# Patient Record
Sex: Female | Born: 1970 | Race: White | Hispanic: No | Marital: Single | Smoking: Never smoker
Health system: Southern US, Community
[De-identification: ages and names within clinical notes are randomized; demographics above are authoritative.]

## PROBLEM LIST (undated history)

## (undated) DIAGNOSIS — H3530 Unspecified macular degeneration: Secondary | ICD-10-CM

## (undated) DIAGNOSIS — F32A Depression, unspecified: Secondary | ICD-10-CM

## (undated) DIAGNOSIS — E282 Polycystic ovarian syndrome: Secondary | ICD-10-CM

## (undated) DIAGNOSIS — I10 Essential (primary) hypertension: Secondary | ICD-10-CM

## (undated) DIAGNOSIS — E785 Hyperlipidemia, unspecified: Secondary | ICD-10-CM

## (undated) DIAGNOSIS — E119 Type 2 diabetes mellitus without complications: Secondary | ICD-10-CM

## (undated) DIAGNOSIS — F329 Major depressive disorder, single episode, unspecified: Secondary | ICD-10-CM

## (undated) DIAGNOSIS — R809 Proteinuria, unspecified: Secondary | ICD-10-CM

## (undated) HISTORY — DX: Proteinuria, unspecified: R80.9

## (undated) HISTORY — DX: Hyperlipidemia, unspecified: E78.5

## (undated) HISTORY — PX: PARTIAL HYSTERECTOMY: SHX80

## (undated) HISTORY — DX: Major depressive disorder, single episode, unspecified: F32.9

## (undated) HISTORY — DX: Depression, unspecified: F32.A

## (undated) HISTORY — DX: Essential (primary) hypertension: I10

## (undated) HISTORY — DX: Unspecified macular degeneration: H35.30

## (undated) HISTORY — DX: Type 2 diabetes mellitus without complications: E11.9

## (undated) HISTORY — DX: Polycystic ovarian syndrome: E28.2

---

## 1999-10-23 ENCOUNTER — Other Ambulatory Visit: Admission: RE | Admit: 1999-10-23 | Discharge: 1999-10-23 | Payer: Self-pay | Admitting: Obstetrics and Gynecology

## 2000-10-28 ENCOUNTER — Other Ambulatory Visit: Admission: RE | Admit: 2000-10-28 | Discharge: 2000-10-28 | Payer: Self-pay | Admitting: Obstetrics and Gynecology

## 2001-11-01 ENCOUNTER — Other Ambulatory Visit: Admission: RE | Admit: 2001-11-01 | Discharge: 2001-11-01 | Payer: Self-pay | Admitting: Obstetrics and Gynecology

## 2002-11-01 ENCOUNTER — Other Ambulatory Visit: Admission: RE | Admit: 2002-11-01 | Discharge: 2002-11-01 | Payer: Self-pay | Admitting: Obstetrics and Gynecology

## 2003-11-08 ENCOUNTER — Other Ambulatory Visit: Admission: RE | Admit: 2003-11-08 | Discharge: 2003-11-08 | Payer: Self-pay | Admitting: Obstetrics and Gynecology

## 2004-03-25 ENCOUNTER — Encounter: Admission: RE | Admit: 2004-03-25 | Discharge: 2004-06-23 | Payer: Self-pay | Admitting: Family Medicine

## 2004-08-22 ENCOUNTER — Encounter: Admission: RE | Admit: 2004-08-22 | Discharge: 2004-11-20 | Payer: Self-pay | Admitting: Family Medicine

## 2004-11-11 ENCOUNTER — Other Ambulatory Visit: Admission: RE | Admit: 2004-11-11 | Discharge: 2004-11-11 | Payer: Self-pay | Admitting: Obstetrics and Gynecology

## 2005-11-27 ENCOUNTER — Other Ambulatory Visit: Admission: RE | Admit: 2005-11-27 | Discharge: 2005-11-27 | Payer: Self-pay | Admitting: Obstetrics and Gynecology

## 2007-03-19 ENCOUNTER — Encounter: Admission: RE | Admit: 2007-03-19 | Discharge: 2007-03-19 | Payer: Self-pay | Admitting: Obstetrics and Gynecology

## 2007-03-26 ENCOUNTER — Encounter: Admission: RE | Admit: 2007-03-26 | Discharge: 2007-03-26 | Payer: Self-pay | Admitting: Obstetrics and Gynecology

## 2007-04-06 ENCOUNTER — Encounter: Admission: RE | Admit: 2007-04-06 | Discharge: 2007-04-06 | Payer: Self-pay | Admitting: Obstetrics and Gynecology

## 2007-04-06 ENCOUNTER — Encounter (INDEPENDENT_AMBULATORY_CARE_PROVIDER_SITE_OTHER): Payer: Self-pay | Admitting: Specialist

## 2007-05-25 ENCOUNTER — Inpatient Hospital Stay (HOSPITAL_COMMUNITY): Admission: RE | Admit: 2007-05-25 | Discharge: 2007-05-27 | Payer: Self-pay | Admitting: Obstetrics and Gynecology

## 2007-05-25 ENCOUNTER — Encounter (INDEPENDENT_AMBULATORY_CARE_PROVIDER_SITE_OTHER): Payer: Self-pay | Admitting: Obstetrics and Gynecology

## 2007-10-11 ENCOUNTER — Encounter: Admission: RE | Admit: 2007-10-11 | Discharge: 2007-10-11 | Payer: Self-pay | Admitting: Obstetrics and Gynecology

## 2008-03-23 ENCOUNTER — Encounter: Admission: RE | Admit: 2008-03-23 | Discharge: 2008-03-23 | Payer: Self-pay | Admitting: Obstetrics and Gynecology

## 2010-01-16 ENCOUNTER — Encounter: Admission: RE | Admit: 2010-01-16 | Discharge: 2010-01-16 | Payer: Self-pay | Admitting: Obstetrics and Gynecology

## 2011-01-03 ENCOUNTER — Other Ambulatory Visit: Payer: Self-pay | Admitting: Obstetrics and Gynecology

## 2011-01-03 DIAGNOSIS — Z1239 Encounter for other screening for malignant neoplasm of breast: Secondary | ICD-10-CM

## 2011-02-05 ENCOUNTER — Ambulatory Visit
Admission: RE | Admit: 2011-02-05 | Discharge: 2011-02-05 | Disposition: A | Discharge status: Home / Self Care | Payer: BC Managed Care – PPO | Source: Ambulatory Visit | Attending: Obstetrics and Gynecology | Admitting: Obstetrics and Gynecology

## 2011-02-05 DIAGNOSIS — Z1239 Encounter for other screening for malignant neoplasm of breast: Secondary | ICD-10-CM

## 2011-02-07 ENCOUNTER — Other Ambulatory Visit: Payer: Self-pay | Admitting: Obstetrics and Gynecology

## 2011-02-07 DIAGNOSIS — R928 Other abnormal and inconclusive findings on diagnostic imaging of breast: Secondary | ICD-10-CM

## 2011-02-17 ENCOUNTER — Ambulatory Visit
Admission: RE | Admit: 2011-02-17 | Discharge: 2011-02-17 | Disposition: A | Discharge status: Home / Self Care | Payer: BC Managed Care – PPO | Source: Ambulatory Visit | Attending: Obstetrics and Gynecology | Admitting: Obstetrics and Gynecology

## 2011-02-17 ENCOUNTER — Ambulatory Visit: Payer: BC Managed Care – PPO

## 2011-02-17 DIAGNOSIS — R928 Other abnormal and inconclusive findings on diagnostic imaging of breast: Secondary | ICD-10-CM

## 2011-02-18 ENCOUNTER — Other Ambulatory Visit: Payer: BC Managed Care – PPO

## 2011-04-18 NOTE — H&P (Signed)
Cristina Moore, DELAGARZA              ACCOUNT NO.:  [id number]   MEDICAL RECORD NO.:  [id number]          PATIENT TYPE:  AMB   LOCATION:  SDC                           [facility]ILITY:  [facility]   PHYSICIAN:  Guy Sandifer. Henderson Cloud, M.D. DATE OF BIRTH:  04-23-1971   DATE OF ADMISSION:  05/25/2007  DATE OF DISCHARGE:                              HISTORY & PHYSICAL   CHIEF COMPLAINT:  Enlarging uterine leiomyomata and adnexal masses.   HISTORY OF PRESENT ILLNESS:  This patient is a 40 year old single white  female, G0, P0, with known uterine leiomyomata.  She has been on the  birth control pill to control abnormal bleeding.  On examination her  uterus is enlarging.  On ultrasound on January 25, 2007, the uterus  measured 11.7 x 8.7 x 12.5 cm.  There are multiple leiomyomata ranging  from 7.9 cm to 1.2 cm.  The left adnexa contains a 4.4 cm mass which  could be either a pedunculated fibroid or an endometrioma, and the right  adnexa contains a 4.9 cm cystic structure, possibly an endometrioma,  possibly a cystadenoma.  After discussion of options, she is being  admitted for a total abdominal hysterectomy with possible bilateral  salpingo-oophorectomy.  Dr. Wanda Plump will place ureteral stents  preoperatively.  The potential risks and complications have been  reviewed preoperatively.   PAST MEDICAL HISTORY:  1. Depression.  2. Chronic hypertension.  3. Diabetes.  4. Hyperlipidemia.   PAST SURGICAL HISTORY:  Benign stereotactic biopsy of the left breast in  May 2008.   FAMILY HISTORY:  Mother is a twin.  Asthma in maternal grandfather.   SOCIAL HISTORY:  Denies tobacco, alcohol or drug abuse.   MEDICATIONS:  Paxil, Alesse, Actos, Amaryl, Lipitor and Zestril.   No known drug allergies.   REVIEW OF SYSTEMS:  NEUROLOGIC:  Denies headache.  CARDIAC:  Denies  chest pain.  PULMONARY:  Denies shortness of breath.  GASTROINTESTINAL:  Denies recent changes in bowel habits.   PHYSICAL EXAMINATION:  VITAL  SIGNS:  Height 5 feet 5 inches, weight 244  pounds.  Blood pressure 118/50.  HEENT:  Without thyromegaly.  LUNGS:  Clear to auscultation.  HEART:  Regular rate and rhythm.  BACK:  Without CVA tenderness.  BREASTS:  Without mass, retraction or discharge.  ABDOMEN:  Obese, soft, nontender, without palpable masses.  PELVIC:  Uterus is 12-14 weeks in size.  Adnexal exam is compromised.  EXTREMITIES:  Grossly within normal limits.  NEUROLOGIC:  Grossly within normal limits.   ASSESSMENT:  1. Uterine leiomyomata.  2. Bilateral adnexal masses.   PLAN:  Total abdominal hysterectomy, possible bilateral salpingo-  oophorectomy.      Guy Sandifer Henderson Cloud, M.D.  Electronically Signed     JET/MEDQ  D:  05/13/2007  T:  05/13/2007  Job:  [id number]

## 2011-04-18 NOTE — Op Note (Signed)
NAMEMACKINSEY, Cristina Moore              ACCOUNT NO.:  [id number]   MEDICAL RECORD NO.:  [id number]          PATIENT TYPE:  AMB   LOCATION:  SDC                           [facility]   PHYSICIAN:  Boston Service, M.D.DATE OF BIRTH:  1971-01-30   DATE OF PROCEDURE:  05/25/2007  DATE OF DISCHARGE:                               OPERATIVE REPORT   INDICATIONS:  A 40 year old female preop for hysterectomy, possible  bilateral salpingo-oophorectomy. Dr. Henderson Cloud had requested placement of  right and left ureteral stents prior to the procedure. Happy to help in  any way that I can.   POSTOPERATIVE DIAGNOSIS:  A 40 year old female preop for hysterectomy,  possible bilateral salpingo-oophorectomy. Dr. Henderson Cloud had requested  placement of right and left ureteral stents prior to the procedure.  Happy to help in any way that I can.   PROCEDURE:  Cystoscopy retrograde, right and left ureteral stent  placement.   SURGEON:  Humphries.   ASSISTANT:  None.   ANESTHESIA:  General.   SPECIMENS:  None.   ESTIMATED BLOOD LOSS:  Minimal.   COMPLICATIONS:  None obvious.   DRAINS:  Right and left ureteral stents.   DESCRIPTION OF PROCEDURE:  The patient was prepped and draped in the  dorsal lithotomy position. After institution of an adequate level of  general anesthesia, a well-lubricated, 21-French panendoscope was gently  inserted at the urethral meatus. Normal urethra and sphincter. Bladder  was carefully inspected with the 17- and 70-degree lenses.   Retrograde catheter was selected, positioned at the right and left  ureteral orifice. Retrograde films were obtained which showed normal  course and caliber of the ureter, pelvis and calices, with prompt  drainage at 3 to 5 minutes. A floppy-tip guide wire was then inserted,  advanced to a position just below the UPJ on both the right and left  sides. A 6-French end-hole catheter was advanced over the indwelling  guidewire. Guidewire was then  withdrawn about a centimeter inside the  ureteral catheter. Cystoscope was then gently withdrawn. Ureteral  catheters remained in place. An 18-French Foley was inserted with  immediate return of several hundred mL of clear irrigate. Balloon was  inflated. Foley was left to straight drain. Right and left ureteral  catheters were then tied to the indwelling Foley catheter and will  remain in place during the procedure and are to be removed when  considered appropriate by Dr. Henderson Cloud. He will dictate his portion of  the procedure.           ______________________________  Boston Service, M.D.     RH/MEDQ  D:  05/25/2007  T:  05/25/2007  Job:  [id number]   cc:   Guy Sandifer. Henderson Cloud, M.D.  Fax: [phone]

## 2011-04-18 NOTE — Discharge Summary (Signed)
Cristina Moore, Cristina Moore              ACCOUNT NO.:  [id number]   MEDICAL RECORD NO.:  [id number]          PATIENT TYPE:  INP   LOCATION:  9305                          [facility]ACILITY:  [facility]   PHYSICIAN:  Guy Sandifer. Henderson Cloud, M.D. DATE OF BIRTH:  24-Nov-1971   DATE OF ADMISSION:  05/25/2007  DATE OF DISCHARGE:  05/27/2007                               DISCHARGE SUMMARY   ADMITTING DIAGNOSES:  1. Uterine leiomyomata.  2. Adnexal masses.   DISCHARGE DIAGNOSIS:  Uterine leiomyomata.   PROCEDURE:  On May 25, 2007, total abdominal hysterectomy.   REASON FOR ADMISSION:  This patient is a 40 year old, single, white  female, G0, P0, with symptomatic enlarging uterine leiomyomata.  Details  are dictated in the history and physical.  She is admitted for surgical  management.   HOSPITAL COURSE:  The patient is admitted to the hospital and undergoes  the above procedure.  On the evening of surgery, she has good pain  relief, is ambulating with stable vital signs, and is afebrile.  Urine  output is adequate.  It is blood tinged secondary to ureteral stents  that were placed during surgery and removed immediately postoperatively.  On the first postoperative day, she is ambulating, tolerating a regular  diet, and is afebrile.  Blood sugars are managed with sliding scale  insulin.  On the day of discharge, she is passing flatus, tolerating  regular, remains afebrile.  Hemoglobin is 9.1.  Pathology is pending.   CONDITION ON DISCHARGE:  Good.   DIET:  Her diabetes diet is prescribed by her primary care physician.   MEDICATIONS:  1. Percocet 5/325 mg #40 one to two p.o. q.6h. p.r.n.  2. Ibuprofen 600 mg q.6h. p.r.n.  3. Tandem iron supplement 1 p.o. daily.   She will resume her other medications that she was taking  preoperatively.  The patient is to call the office for problems  including but not limited to temperature of 101 degrees, persistent  nausea, vomiting, heavy vaginal bleeding or increasing  pain.  She is to  refrain from operation of automobiles, heavy lifting or driving.  Follow-  up is in the office in 1 week for staple removal.     Guy Sandifer. Henderson Cloud, M.D.  Electronically Signed    JET/MEDQ  D:  05/27/2007  T:  05/27/2007  Job:  [id number]

## 2011-04-18 NOTE — Op Note (Signed)
Cristina Moore, Cristina Moore              ACCOUNT NO.:  [id number]   MEDICAL RECORD NO.:  [id number]          PATIENT TYPE:  AMB   LOCATION:  SDC                           [facility]   PHYSICIAN:  Guy Sandifer. Henderson Cloud, M.D. DATE OF BIRTH:  1971-07-21   DATE OF PROCEDURE:  05/25/2007  DATE OF DISCHARGE:                               OPERATIVE REPORT   PREOPERATIVE DIAGNOSIS:  1. Uterine leiomyomata.  2. Bilateral adnexal masses.   POSTOPERATIVE DIAGNOSIS:  Uterine leiomyomata.   PROCEDURE:  Total abdominal hysterectomy.   SURGEON:  Guy Sandifer. Henderson Cloud, M.D.   ASSISTANT:  Zelphia Cairo, M.D.   ANESTHESIA:  General endotracheal intubation.   SPECIMENS:  Uterus sent to pathology (weight 622 grams).   ESTIMATED BLOOD LOSS:  700 mL.   INDICATIONS:  The patient is a 40 year old single white female, G0, P0  with enlarging uterine leiomyomata.  Details are dictated history and  physical.  After discussion the options she is admitted for total  abdominal hysterectomy.  Possible bilateral salpingo-oophorectomy,  although the patient would like to keep one or both ovaries if they  appear normal.  Potential risks, complications have been discussed  preoperatively including but not limited to infection, bowel, bladder,  ureteral damage, bleeding requiring transfusion of blood products with  possible transfusion reaction, HIV and hepatitis acquisition, DVT, PE,  pneumonia, fistula formation, postoperative pain.  All questions were  answered and consent is signed on the chart.  Dr. Boston Service will  place bilateral ureteral stents prior to the hysterectomy.   FINDINGS:  Uterus contains multiple leiomyomata ranging from 3-8 cm.  There is at least one 3 cm pedunculated fibroid on the left uterine  fundus immediately above the ovary.  The ovaries appear normal.  Anterior posterior cul-de-sac is normal and the appendix is normal.   PROCEDURE:  The patient is taken to operating room where she  is  identified, placed in dorsosupine position and general anesthesia is  induced via endotracheal intubation.  She then has bilateral ureteral  stents placed by Dr. Wanda Plump which is dictated under separate note.  The patient is then repositioned in the frog-leg position where she is  prepped abdominally and vaginally.  Foley catheter was already in place.  She is draped in a sterile fashion.  Skin is entered through a  Pfannenstiel incision and dissection is carried out layers to the  peritoneum.  Peritoneum is incised and extended superiorly and  inferiorly.  The Balfour retractor is placed.  Bladder blade was placed  and the bowel was packed away.  Uterus is grasped with a thyroid  tenaculum and elevated in the operative field.  Then using the LigaSure  cautery instrument the left proximal ligaments are taken down.  The  anterior leaf of the broad ligament is taken down.  The remainder of the  proximal ligaments are taken down.  Bladder flap is developed.  Similar  procedure is carried out on the right side.  The superior branch of the  uterine vessels are taken down bilaterally.  Then using a malleable  retractor as a backstop, a long  knife is used to amputate the uterine  fundus from the cervix.  While doing this, several large collateral  vessels were encountered which are clamped.  The specimen is completely  cut free.  The cardinal ligaments are taken down bilaterally using  clamps with 0 Monocryl pops.  The vagina is then entered bilaterally and  cut and the specimen is cut free with Satinsky scissors.  Inspection  reveals entire cervix has been removed.  Vagina is then closed with  figure-of-eight 0 Monocryl sutures.  Good hemostasis is noted all  around.  The angle sutures were then tied in the midline.  Copious  irrigation is carried out and good hemostasis is noted.  The ureters  were palpated throughout the case and felt to be well clear of the area  of surgery.  Packs and  retractors were removed and the anterior  peritoneum was closed in running fashion with 2-0 Monocryl suture which  was also used to reapproximate the pyramidalis muscle in the midline.  The anterior rectus fascia is closed in running fashion with 0 PDS  starting at each angle and meeting in the middle.  The subcutaneous  layer is closed with interrupted 0 plain sutures and the skin is closed  with clips.  A pressure dressing is applied.  The ureteral stents were  then removed bilaterally.  They are noted to be intact.  Foley catheter  was left in place.  All counts correct.  The patient is awakened, taken  to recovery room in stable condition.      Guy Sandifer Henderson Cloud, M.D.  Electronically Signed     JET/MEDQ  D:  05/25/2007  T:  05/25/2007  Job:  [id number]

## 2011-09-17 LAB — CBC
HCT: 27.8 — ABNORMAL LOW
Hemoglobin: 9.1 — ABNORMAL LOW
MCHC: 32.7
MCV: 80.5
RBC: 3.45 — ABNORMAL LOW
RDW: 15.6 — ABNORMAL HIGH
RDW: 15.9 — ABNORMAL HIGH
WBC: 15.3 — ABNORMAL HIGH

## 2011-09-17 LAB — COMPREHENSIVE METABOLIC PANEL
AST: 17
BUN: 12
CO2: 28
Calcium: 9.1
Chloride: 104
GFR calc Af Amer: >60
GFR calc non Af Amer: >60
Glucose, Bld: 150 — ABNORMAL HIGH
Total Bilirubin: 0.4

## 2011-09-17 LAB — HCG, SERUM, QUALITATIVE: Preg, Serum: NEGATIVE

## 2011-10-27 ENCOUNTER — Other Ambulatory Visit: Payer: Self-pay | Admitting: Obstetrics and Gynecology

## 2011-10-27 DIAGNOSIS — Z1231 Encounter for screening mammogram for malignant neoplasm of breast: Secondary | ICD-10-CM

## 2012-02-18 ENCOUNTER — Ambulatory Visit
Admission: RE | Admit: 2012-02-18 | Discharge: 2012-02-18 | Disposition: A | Discharge status: Home / Self Care | Payer: BC Managed Care – PPO | Source: Ambulatory Visit | Attending: Obstetrics and Gynecology | Admitting: Obstetrics and Gynecology

## 2012-02-18 DIAGNOSIS — Z1231 Encounter for screening mammogram for malignant neoplasm of breast: Secondary | ICD-10-CM

## 2012-11-11 ENCOUNTER — Other Ambulatory Visit: Payer: Self-pay | Admitting: Obstetrics and Gynecology

## 2012-11-11 DIAGNOSIS — Z1231 Encounter for screening mammogram for malignant neoplasm of breast: Secondary | ICD-10-CM

## 2013-02-11 ENCOUNTER — Ambulatory Visit
Admission: RE | Admit: 2013-02-11 | Discharge: 2013-02-11 | Disposition: A | Discharge status: Home / Self Care | Payer: BC Managed Care – PPO | Source: Ambulatory Visit | Attending: Obstetrics and Gynecology | Admitting: Obstetrics and Gynecology

## 2013-02-11 DIAGNOSIS — Z1231 Encounter for screening mammogram for malignant neoplasm of breast: Secondary | ICD-10-CM

## 2013-12-16 ENCOUNTER — Other Ambulatory Visit: Payer: Self-pay

## 2013-12-16 DIAGNOSIS — Z1231 Encounter for screening mammogram for malignant neoplasm of breast: Secondary | ICD-10-CM

## 2014-02-13 ENCOUNTER — Ambulatory Visit
Admission: RE | Admit: 2014-02-13 | Discharge: 2014-02-13 | Disposition: A | Discharge status: Home / Self Care | Payer: BC Managed Care – PPO | Source: Ambulatory Visit

## 2014-02-13 DIAGNOSIS — Z1231 Encounter for screening mammogram for malignant neoplasm of breast: Secondary | ICD-10-CM

## 2015-01-23 ENCOUNTER — Other Ambulatory Visit: Payer: Self-pay

## 2015-01-23 DIAGNOSIS — Z1231 Encounter for screening mammogram for malignant neoplasm of breast: Secondary | ICD-10-CM

## 2015-02-22 ENCOUNTER — Ambulatory Visit
Admission: RE | Admit: 2015-02-22 | Discharge: 2015-02-22 | Disposition: A | Discharge status: Home / Self Care | Payer: BC Managed Care – PPO | Source: Ambulatory Visit

## 2015-02-22 DIAGNOSIS — Z1231 Encounter for screening mammogram for malignant neoplasm of breast: Secondary | ICD-10-CM

## 2016-01-18 ENCOUNTER — Other Ambulatory Visit: Payer: Self-pay

## 2016-01-18 DIAGNOSIS — Z1231 Encounter for screening mammogram for malignant neoplasm of breast: Secondary | ICD-10-CM

## 2016-02-25 ENCOUNTER — Ambulatory Visit
Admission: RE | Admit: 2016-02-25 | Discharge: 2016-02-25 | Disposition: A | Discharge status: Home / Self Care | Payer: BC Managed Care – PPO | Source: Ambulatory Visit

## 2016-02-25 DIAGNOSIS — Z1231 Encounter for screening mammogram for malignant neoplasm of breast: Secondary | ICD-10-CM

## 2017-03-31 HISTORY — PX: BREAST BIOPSY: SHX20

## 2018-01-21 ENCOUNTER — Other Ambulatory Visit: Payer: Self-pay | Admitting: Obstetrics and Gynecology

## 2018-01-21 DIAGNOSIS — Z1231 Encounter for screening mammogram for malignant neoplasm of breast: Secondary | ICD-10-CM

## 2018-02-09 ENCOUNTER — Ambulatory Visit
Admission: RE | Admit: 2018-02-09 | Discharge: 2018-02-09 | Disposition: A | Discharge status: Home / Self Care | Payer: BC Managed Care – PPO | Source: Ambulatory Visit | Attending: Obstetrics and Gynecology | Admitting: Obstetrics and Gynecology

## 2018-02-09 DIAGNOSIS — Z1231 Encounter for screening mammogram for malignant neoplasm of breast: Secondary | ICD-10-CM

## 2019-01-13 ENCOUNTER — Other Ambulatory Visit: Payer: Self-pay | Admitting: Obstetrics and Gynecology

## 2019-01-13 DIAGNOSIS — Z1231 Encounter for screening mammogram for malignant neoplasm of breast: Secondary | ICD-10-CM

## 2019-02-17 ENCOUNTER — Inpatient Hospital Stay: Admission: RE | Admit: 2019-02-17 | Payer: BC Managed Care – PPO | Source: Ambulatory Visit

## 2019-04-18 ENCOUNTER — Other Ambulatory Visit: Payer: Self-pay

## 2019-04-18 ENCOUNTER — Encounter (INDEPENDENT_AMBULATORY_CARE_PROVIDER_SITE_OTHER): Payer: Self-pay

## 2019-04-18 ENCOUNTER — Ambulatory Visit
Admission: RE | Admit: 2019-04-18 | Discharge: 2019-04-18 | Disposition: A | Discharge status: Home / Self Care | Payer: BC Managed Care – PPO | Source: Ambulatory Visit | Attending: Obstetrics and Gynecology | Admitting: Obstetrics and Gynecology

## 2019-04-18 DIAGNOSIS — Z1231 Encounter for screening mammogram for malignant neoplasm of breast: Secondary | ICD-10-CM

## 2020-04-05 ENCOUNTER — Ambulatory Visit (INDEPENDENT_AMBULATORY_CARE_PROVIDER_SITE_OTHER): Payer: BC Managed Care – PPO | Admitting: Ophthalmology

## 2020-04-05 ENCOUNTER — Encounter (INDEPENDENT_AMBULATORY_CARE_PROVIDER_SITE_OTHER): Payer: Self-pay | Admitting: Ophthalmology

## 2020-04-05 ENCOUNTER — Other Ambulatory Visit: Payer: Self-pay

## 2020-04-05 DIAGNOSIS — H4052X2 Glaucoma secondary to other eye disorders, left eye, moderate stage: Secondary | ICD-10-CM | POA: Diagnosis not present

## 2020-04-05 DIAGNOSIS — H353122 Nonexudative age-related macular degeneration, left eye, intermediate dry stage: Secondary | ICD-10-CM | POA: Diagnosis not present

## 2020-04-05 DIAGNOSIS — H353221 Exudative age-related macular degeneration, left eye, with active choroidal neovascularization: Secondary | ICD-10-CM

## 2020-04-05 DIAGNOSIS — H353114 Nonexudative age-related macular degeneration, right eye, advanced atrophic with subfoveal involvement: Secondary | ICD-10-CM | POA: Diagnosis not present

## 2020-04-05 DIAGNOSIS — B399 Histoplasmosis, unspecified: Secondary | ICD-10-CM

## 2020-04-05 DIAGNOSIS — H32 Chorioretinal disorders in diseases classified elsewhere: Secondary | ICD-10-CM

## 2020-04-05 MED ORDER — AFLIBERCEPT 2MG/0.05ML IZ SOLN FOR KALEIDOSCOPE
2.0000 mg | INTRAVITREAL | Status: AC | PRN
  Administered 2020-04-05: 2 mg via INTRAVITREAL

## 2020-04-05 NOTE — Assessment & Plan Note (Signed)

## 2020-04-05 NOTE — Progress Notes (Signed)
04/05/2020     CHIEF COMPLAINT Patient presents for Retina Follow Up   HISTORY OF PRESENT ILLNESS: Cristina Moore is a 49 y.o. female who presents to the clinic today for:   HPI    Retina Follow Up    Patient presents with  Wet AMD.  In left eye.  Duration of 5 weeks.  Since onset it is stable.          Comments    5 week follow up - OCT OU, Possible Eylea OS Patient denies change in vision and overall has no complaints. LBS 115 this AM A1C 7.05 February 2020        Last edited by Berenice Bouton on 04/05/2020  2:52 PM. (History)      Referring physician: Maurice Small, MD [address] [place],  [place] [address]  HISTORICAL INFORMATION:   Selected notes from the MEDICAL RECORD NUMBER       CURRENT MEDICATIONS: No current outpatient medications on file. (Ophthalmic Drugs)   No current facility-administered medications for this visit. (Ophthalmic Drugs)   Current Outpatient Medications (Other)  Medication Sig   aspirin 81 MG tablet Take 81 mg by mouth daily.   atorvastatin (LIPITOR) 40 MG tablet Take 40 mg by mouth daily.   buPROPion (ZYBAN) 150 MG 12 hr tablet Take 150 mg by mouth 2 (two) times daily.   glimepiride (AMARYL) 2 MG tablet Take 2 mg by mouth daily with breakfast.   lisinopril (PRINIVIL,ZESTRIL) 20 MG tablet Take 20 mg by mouth daily.   metFORMIN (GLUCOPHAGE) 500 MG tablet Take by mouth 2 (two) times daily with a meal. Takes 1& 1/2 tables twice daily with meals   Multiple Vitamin (MULTI-VITAMIN DAILY PO) Take by mouth.   No current facility-administered medications for this visit. (Other)      REVIEW OF SYSTEMS:    ALLERGIES No Known Allergies  PAST MEDICAL HISTORY Past Medical History:  Diagnosis Date   Depression    Diabetes mellitus without complication (HCC)    Hyperlipidemia    Hypertension    Macular degeneration    PCOS (polycystic ovarian syndrome)    Proteinuria    Past Surgical History:    Procedure Laterality Date   PARTIAL HYSTERECTOMY      FAMILY HISTORY Family History  Problem Relation Age of Onset   Hyperlipidemia Father    Hypertension Father     SOCIAL HISTORY Social History   Tobacco Use   Smoking status: Never Smoker   Smokeless tobacco: Never Used  Substance Use Topics   Alcohol use: Not on file   Drug use: Not on file         OPHTHALMIC EXAM:  Base Eye Exam    Visual Acuity (Snellen - Linear)      Right Left   Dist cc 20/200 20/30   Dist ph cc NI NI   Correction: Contacts       Tonometry (Tonopen, 3:01 PM)      Right Left   Pressure 17 18       Pupils      Pupils Dark Light Shape React APD   Right PERRL 4 3 Round Slow None   Left PERRL 4 3 Round Slow None       Visual Fields (Counting fingers)      Left Right    Full Full       Extraocular Movement      Right Left    Full Full  Neuro/Psych    Oriented x3: Yes   Mood/Affect: Normal       Dilation    Left eye: 1.0% Mydriacyl, 2.5% Phenylephrine @ 3:01 PM        Slit Lamp and Fundus Exam    External Exam      Right Left   External Normal Normal       Slit Lamp Exam      Right Left   Lids/Lashes Normal Normal   Conjunctiva/Sclera White and quiet White and quiet   Cornea Clear Clear   Anterior Chamber Deep and quiet Deep and quiet   Iris Round and reactive Round and reactive   Lens  1+ Nuclear sclerosis   Anterior Vitreous Normal Normal       Fundus Exam      Right Left   Posterior Vitreous  Normal   Disc  Peripapillary atrophy   C/D Ratio  0.35   Macula  Geographic atrophy   Vessels  Normal   Periphery  Normal          IMAGING AND PROCEDURES  Imaging and Procedures for 04/05/20  OCT, Retina - OU - Both Eyes       Right Eye Quality was good. Scan locations included subfoveal. Central Foveal Thickness: 282.   Left Eye Quality was good. Scan locations included subfoveal. Central Foveal Thickness: 307. Progression has improved.  Findings include abnormal foveal contour, subretinal scarring, central retinal atrophy, outer retinal atrophy.   Notes Interval follow-up, with old prior juxta foveal choroidal neovascular membrane now stable on Eylea.  Repeat Eylea today and examination extension by 1 week       Intravitreal Injection, Pharmacologic Agent - OS - Left Eye       Time Out 04/05/2020. 4:23 PM. Confirmed correct patient, procedure, site, and patient consented.   Anesthesia Topical anesthesia was used. Anesthetic medications included Akten 3.5%.   Procedure Preparation included Tobramycin 0.3%, 10% betadine to eyelids. A 30 gauge needle was used.   Injection:  2 mg aflibercept (EYLEA) SOLN   NDC: [id number], Lot: [id number]   Route: Intravitreal, Site: Left Eye, Waste: 0 mg  Post-op Post injection exam found visual acuity of at least counting fingers. The patient tolerated the procedure well. There were no complications. The patient received written and verbal post procedure care education. Post injection medications were not given.                 ASSESSMENT/PLAN:  Exudative age-related macular degeneration of left eye with active choroidal neovascularization (HCC) The nature of wet macular degeneration was discussed with the patient.  Forms of therapy reviewed include the use of Anti-VEGF medications injected painlessly into the eye, as well as other possible treatment modalities, including thermal laser therapy. Fellow eye involvement and risks were discussed with the patient. Upon the finding of wet age related macular degeneration, treatment will be offered. The treatment regimen is on a treat as needed basis with the intent to treat if necessary and extend interval of exams when possible. On average 1 out of 6 patients do not need lifetime therapy. However, the risk of recurrent disease is high for a lifetime.  Initially monthly, then periodic, examinations and evaluations will determine  whether the next treatment is required on the day of the examination.        ICD-10-CM   1. Exudative age-related macular degeneration of left eye with active choroidal neovascularization (HCC)  H35.3221 OCT, Retina - OU - Both Eyes  Intravitreal Injection, Pharmacologic Agent - OS - Left Eye    aflibercept (EYLEA) SOLN 2 mg  2. Intermediate stage nonexudative age-related macular degeneration of left eye  H35.3122   3. Neovascular glaucoma of left eye, moderate stage  H40.52X2   4. 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  2.  3.  Ophthalmic Meds Ordered this visit:  Meds ordered this encounter  Medications   aflibercept (EYLEA) SOLN 2 mg       Return in about 6 weeks (around 05/17/2020) for dilate, OS, EYLEA OCT.  There are no Patient Instructions on file for this visit.   Explained the diagnoses, plan, and follow up with the patient and they expressed understanding.  Patient expressed understanding of the importance of proper follow up care.   Clent Demark Karmin Kasprzak M.D. Diseases & Surgery of the Retina and Vitreous [facility] [place] 04/05/20     Abbreviations: M myopia (nearsighted); A astigmatism; H hyperopia (farsighted); P presbyopia; Mrx spectacle prescription;  CTL contact lenses; OD right eye; OS left eye; OU both eyes  XT exotropia; ET esotropia; PEK punctate epithelial keratitis; PEE punctate epithelial erosions; DES dry eye syndrome; MGD meibomian gland dysfunction; ATs artificial tears; PFAT's preservative free artificial tears; Humphreys nuclear sclerotic cataract; PSC posterior subcapsular cataract; ERM epi-retinal membrane; PVD posterior vitreous detachment; RD retinal detachment; DM diabetes mellitus; DR diabetic retinopathy; NPDR non-proliferative diabetic retinopathy; PDR proliferative diabetic  retinopathy; CSME clinically significant macular edema; DME diabetic macular edema; dbh dot blot hemorrhages; CWS cotton wool spot; POAG primary open angle glaucoma; C/D cup-to-disc ratio; HVF humphrey visual field; GVF goldmann visual field; OCT optical coherence tomography; IOP intraocular pressure; BRVO Branch retinal vein occlusion; CRVO central retinal vein occlusion; CRAO central retinal artery occlusion; BRAO branch retinal artery occlusion; RT retinal tear; SB scleral buckle; PPV pars plana vitrectomy; VH Vitreous hemorrhage; PRP panretinal laser photocoagulation; IVK intravitreal kenalog; VMT vitreomacular traction; MH Macular hole;  NVD neovascularization of the disc; NVE neovascularization elsewhere; AREDS age related eye disease study; ARMD age related macular degeneration; POAG primary open angle glaucoma; EBMD epithelial/anterior basement membrane dystrophy; ACIOL anterior chamber intraocular lens; IOL intraocular lens; PCIOL posterior chamber intraocular lens; Phaco/IOL phacoemulsification with intraocular lens placement; [place] photorefractive keratectomy; LASIK laser assisted in situ keratomileusis; HTN hypertension; DM diabetes mellitus; COPD chronic obstructive pulmonary disease

## 2020-04-10 ENCOUNTER — Other Ambulatory Visit: Payer: Self-pay | Admitting: Obstetrics and Gynecology

## 2020-04-10 DIAGNOSIS — Z1231 Encounter for screening mammogram for malignant neoplasm of breast: Secondary | ICD-10-CM

## 2020-04-23 ENCOUNTER — Ambulatory Visit (INDEPENDENT_AMBULATORY_CARE_PROVIDER_SITE_OTHER): Payer: BC Managed Care – PPO | Admitting: Ophthalmology

## 2020-04-23 ENCOUNTER — Other Ambulatory Visit: Payer: Self-pay

## 2020-04-23 ENCOUNTER — Encounter (INDEPENDENT_AMBULATORY_CARE_PROVIDER_SITE_OTHER): Payer: Self-pay | Admitting: Ophthalmology

## 2020-04-23 DIAGNOSIS — H43812 Vitreous degeneration, left eye: Secondary | ICD-10-CM | POA: Insufficient documentation

## 2020-04-23 DIAGNOSIS — H353221 Exudative age-related macular degeneration, left eye, with active choroidal neovascularization: Secondary | ICD-10-CM

## 2020-04-23 NOTE — Assessment & Plan Note (Signed)
The nature of posterior vitreous detachment was discussed with the patient as well as its physiology, its age prevalence, and its possible implication regarding retinal breaks and detachment.  An informational brochure was given to the patient.  All the patient's questions were answered.  The patient was asked to return if new or different flashes or floaters develops.   Patient was instructed to contact office immediately if any changes were noticed. I explained to the patient that vitreous inside the eye is similar to jello inside a bowl. As the jello melts it can start to pull away from the bowl, similarly the vitreous throughout our lives can begin to pull away from the retina. That process is called a posterior vitreous detachment. In some cases, the vitreous can tug hard enough on the retina to form a retinal tear. I discussed with the patient the signs and symptoms of a retinal detachment.  Do not rub the eye.  OS new, Weiss ring present.  Small silicone bubbles apparently in the central visual axis

## 2020-04-23 NOTE — Progress Notes (Signed)
04/23/2020     CHIEF COMPLAINT Patient presents for Spots and/or Floaters   HISTORY OF PRESENT ILLNESS: Cristina Moore is a 49 y.o. female who presents to the clinic today for:   HPI    Spots and/or Floaters    In left eye.  Characterized as small and large.  Since onset it is stable.  Associated symptoms include blurred vision.  Negative for flashes.  I, the attending physician,  performed the HPI with the patient and updated documentation appropriately.          Comments    Pt c/o multiple floaters or "bubbles" in OS. Pt states she first noticed the floaters on 04/17/20 and has been seeing them since then. Pt states there is a large floaters that will move into center of vision and it is hard for her to read. The other floaters stay in her periphery. Pt states she has been stressed this past week. Denies FOL 2.5 Weeks S\p Avastin OS        Last edited by Tilda Franco on 04/23/2020  3:12 PM. (History)      Referring physician: Kelton Pillar, MD [address] [place],  [place] [id number]  HISTORICAL INFORMATION:   Selected notes from the [facility]: No current outpatient medications on file. (Ophthalmic Drugs)   No current facility-administered medications for this visit. (Ophthalmic Drugs)   Current Outpatient Medications (Other)  Medication Sig  . aspirin 81 MG tablet Take 81 mg by mouth daily.  Marland Kitchen atorvastatin (LIPITOR) 40 MG tablet Take 40 mg by mouth daily.  Marland Kitchen buPROPion (ZYBAN) 150 MG 12 hr tablet Take 150 mg by mouth 2 (two) times daily.  Marland Kitchen glimepiride (AMARYL) 2 MG tablet Take 2 mg by mouth daily with breakfast.  . lisinopril (PRINIVIL,ZESTRIL) 20 MG tablet Take 20 mg by mouth daily.  . metFORMIN (GLUCOPHAGE) 500 MG tablet Take by mouth 2 (two) times daily with a meal. Takes 1& 1/2 tables twice daily with meals  . Multiple Vitamin (MULTI-VITAMIN DAILY PO) Take by mouth.   No current facility-administered  medications for this visit. (Other)      REVIEW OF SYSTEMS:    ALLERGIES No Known Allergies  PAST MEDICAL HISTORY Past Medical History:  Diagnosis Date  . Depression   . Diabetes mellitus without complication ([place])   . Hyperlipidemia   . Hypertension   . Macular degeneration   . PCOS (polycystic ovarian syndrome)   . Proteinuria    Past Surgical History:  Procedure Laterality Date  . PARTIAL HYSTERECTOMY      FAMILY HISTORY Family History  Problem Relation Age of Onset  . Hyperlipidemia Father   . Hypertension Father     SOCIAL HISTORY Social History   Tobacco Use  . Smoking status: Never Smoker  . Smokeless tobacco: Never Used  Substance Use Topics  . Alcohol use: Not on file  . Drug use: Not on file         OPHTHALMIC EXAM:  Base Eye Exam    Visual Acuity (Snellen - Linear)      Right Left   Dist cc 20/400 20/30 -2   Dist ph cc NI        Tonometry (Tonopen, 3:11 PM)      Right Left   Pressure 14 15       Pupils      Dark Light Shape React APD   Right  4 3 Round Slow None   Left 4 3 Round Slow None       Neuro/Psych    Oriented x3: Yes   Mood/Affect: Normal       Dilation    Left eye: 1.0% Mydriacyl, 2.5% Phenylephrine @ 3:11 PM        Slit Lamp and Fundus Exam    External Exam      Right Left   External Normal Normal       Slit Lamp Exam      Right Left   Lids/Lashes Normal Normal   Conjunctiva/Sclera White and quiet White and quiet   Cornea Clear Clear   Anterior Chamber Deep and quiet Deep and quiet   Iris Round and reactive Round and reactive   Lens  1+ Nuclear sclerosis   Anterior Vitreous Normal Normal       Fundus Exam      Right Left   Posterior Vitreous  Posterior vitreous detachment, Central vitreous floaters   Disc  Peripapillary atrophy   C/D Ratio  0.35   Macula  Geographic atrophy   Vessels  Normal   Periphery  Normal,,          IMAGING AND PROCEDURES  Imaging and Procedures for  04/23/20  OCT, Retina - OU - Both Eyes       Right Eye Quality was good. Scan locations included subfoveal. Central Foveal Thickness: 264. Progression has been stable. Findings include abnormal foveal contour, retinal drusen , disciform scar.   Left Eye Quality was good. Scan locations included subfoveal. Central Foveal Thickness: 301. Progression has been stable. Findings include abnormal foveal contour, pigment epithelial detachment.   Notes STABLE OD  PVD OS WITH VIT DEBRIS  No active CNVM OU                ASSESSMENT/PLAN:  Posterior vitreous detachment of left eye   The nature of posterior vitreous detachment was discussed with the patient as well as its physiology, its age prevalence, and its possible implication regarding retinal breaks and detachment.  An informational brochure was given to the patient.  All the patient's questions were answered.  The patient was asked to return if new or different flashes or floaters develops.   Patient was instructed to contact office immediately if any changes were noticed. I explained to the patient that vitreous inside the eye is similar to jello inside a bowl. As the jello melts it can start to pull away from the bowl, similarly the vitreous throughout our lives can begin to pull away from the retina. That process is called a posterior vitreous detachment. In some cases, the vitreous can tug hard enough on the retina to form a retinal tear. I discussed with the patient the signs and symptoms of a retinal detachment.  Do not rub the eye.  OS new, Weiss ring present.  Small silicone bubbles apparently in the central visual axis      ICD-10-CM   1. Exudative age-related macular degeneration of left eye with active choroidal neovascularization (HCC)  H35.3221 OCT, Retina - OU - Both Eyes  2. Posterior vitreous detachment of left eye  H43.812 OCT, Retina - OU - Both Eyes    1. PVD with vitreous debris OS - Will observe  2. Return for  scheduled Eylea   3.  Ophthalmic Meds Ordered this visit:  No orders of the defined types were placed in this encounter.      Return As scheduled, for  EYLEA OCT.  There are no Patient Instructions on file for this visit.   Explained the diagnoses, plan, and follow up with the patient and they expressed understanding.  Patient expressed understanding of the importance of proper follow up care.   Alford Highland Daelan Gatt M.D. Diseases & Surgery of the Retina and Vitreous [facility] 04/23/20     Abbreviations: M myopia (nearsighted); A astigmatism; H hyperopia (farsighted); P presbyopia; Mrx spectacle prescription;  CTL contact lenses; OD right eye; OS left eye; OU both eyes  XT exotropia; ET esotropia; PEK punctate epithelial keratitis; PEE punctate epithelial erosions; DES dry eye syndrome; MGD meibomian gland dysfunction; ATs artificial tears; PFAT's preservative free artificial tears; NSC nuclear sclerotic cataract; PSC posterior subcapsular cataract; ERM epi-retinal membrane; PVD posterior vitreous detachment; RD retinal detachment; DM diabetes mellitus; DR diabetic retinopathy; NPDR non-proliferative diabetic retinopathy; PDR proliferative diabetic retinopathy; CSME clinically significant macular edema; DME diabetic macular edema; dbh dot blot hemorrhages; CWS cotton wool spot; POAG primary open angle glaucoma; C/D cup-to-disc ratio; HVF humphrey visual field; GVF goldmann visual field; OCT optical coherence tomography; IOP intraocular pressure; BRVO Branch retinal vein occlusion; CRVO central retinal vein occlusion; CRAO central retinal artery occlusion; BRAO branch retinal artery occlusion; RT retinal tear; SB scleral buckle; PPV pars plana vitrectomy; VH Vitreous hemorrhage; PRP panretinal laser photocoagulation; IVK intravitreal kenalog; VMT vitreomacular traction; MH Macular hole;  NVD neovascularization of the disc; NVE neovascularization elsewhere; AREDS age related eye  disease study; ARMD age related macular degeneration; POAG primary open angle glaucoma; EBMD epithelial/anterior basement membrane dystrophy; ACIOL anterior chamber intraocular lens; IOL intraocular lens; PCIOL posterior chamber intraocular lens; Phaco/IOL phacoemulsification with intraocular lens placement; PRK photorefractive keratectomy; LASIK laser assisted in situ keratomileusis; HTN hypertension; DM diabetes mellitus; COPD chronic obstructive pulmonary disease

## 2020-05-03 ENCOUNTER — Ambulatory Visit
Admission: RE | Admit: 2020-05-03 | Discharge: 2020-05-03 | Disposition: A | Discharge status: Home / Self Care | Payer: BC Managed Care – PPO | Source: Ambulatory Visit | Attending: Obstetrics and Gynecology | Admitting: Obstetrics and Gynecology

## 2020-05-03 ENCOUNTER — Other Ambulatory Visit: Payer: Self-pay

## 2020-05-03 DIAGNOSIS — Z1231 Encounter for screening mammogram for malignant neoplasm of breast: Secondary | ICD-10-CM

## 2020-05-17 ENCOUNTER — Ambulatory Visit (INDEPENDENT_AMBULATORY_CARE_PROVIDER_SITE_OTHER): Payer: BC Managed Care – PPO | Admitting: Ophthalmology

## 2020-05-17 ENCOUNTER — Encounter (INDEPENDENT_AMBULATORY_CARE_PROVIDER_SITE_OTHER): Payer: Self-pay | Admitting: Ophthalmology

## 2020-05-17 ENCOUNTER — Other Ambulatory Visit: Payer: Self-pay

## 2020-05-17 ENCOUNTER — Encounter (INDEPENDENT_AMBULATORY_CARE_PROVIDER_SITE_OTHER): Payer: BC Managed Care – PPO | Admitting: Ophthalmology

## 2020-05-17 DIAGNOSIS — H43812 Vitreous degeneration, left eye: Secondary | ICD-10-CM | POA: Diagnosis not present

## 2020-05-17 DIAGNOSIS — H353221 Exudative age-related macular degeneration, left eye, with active choroidal neovascularization: Secondary | ICD-10-CM

## 2020-05-17 MED ORDER — AFLIBERCEPT 2MG/0.05ML IZ SOLN FOR KALEIDOSCOPE
2.0000 mg | INTRAVITREAL | Status: AC | PRN
  Administered 2020-05-17: 2 mg via INTRAVITREAL

## 2020-05-17 NOTE — Assessment & Plan Note (Signed)
The nature of vitreous floaters was discussed with the patient as well as their frequent occurrence with development of posterior vitreous detachment. The significance of flashes and field cuts was discussed with the patient. The need for a dilated fundus examination was discussed and advice given to return immediately for new or different floaters .  More uncommonly, vitreous floaters, debris, or clouds of haze may develop with impact on visual functioning.  If the personal impact to the patient is minor, observation usually results in diminishing symptoms.  If visual impact hampers activity of daily living over a period of time, medical interventions are available to change the condition, including laser vitreolysis or more commonly, surgical intervention to remove the visual impactful floaters. 

## 2020-05-17 NOTE — Progress Notes (Signed)
05/17/2020     CHIEF COMPLAINT Patient presents for Retina Follow Up   HISTORY OF PRESENT ILLNESS: Cristina Moore is a 49 y.o. female who presents to the clinic today for:   HPI    Retina Follow Up    Patient presents with  Wet AMD.  In left eye.  Duration of 6 weeks.  Since onset it is stable.          Comments    6 week follow up- OCT OU, Poss Eylea OS Patient denies change in vision and overall has no complaints, except for floaters OS. , Patient is more than somewhat bothered by ongoing vitreous debris and floaters centrally.        Last edited by Hurman Horn, MD on 05/17/2020  3:17 PM. (History)      Referring physician: Kelton Pillar, MD [address] [place],  [place] [address]  HISTORICAL INFORMATION:   Selected notes from the [facility]: No current outpatient medications on file. (Ophthalmic Drugs)   No current facility-administered medications for this visit. (Ophthalmic Drugs)   Current Outpatient Medications (Other)  Medication Sig  . aspirin 81 MG tablet Take 81 mg by mouth daily.  Marland Kitchen atorvastatin (LIPITOR) 40 MG tablet Take 40 mg by mouth daily.  Marland Kitchen buPROPion (ZYBAN) 150 MG 12 hr tablet Take 150 mg by mouth 2 (two) times daily.  Marland Kitchen glimepiride (AMARYL) 2 MG tablet Take 2 mg by mouth daily with breakfast.  . lisinopril (PRINIVIL,ZESTRIL) 20 MG tablet Take 20 mg by mouth daily.  . metFORMIN (GLUCOPHAGE) 500 MG tablet Take by mouth 2 (two) times daily with a meal. Takes 1& 1/2 tables twice daily with meals  . Multiple Vitamin (MULTI-VITAMIN DAILY PO) Take by mouth.   No current facility-administered medications for this visit. (Other)      REVIEW OF SYSTEMS:    ALLERGIES No Known Allergies  PAST MEDICAL HISTORY Past Medical History:  Diagnosis Date  . Depression   . Diabetes mellitus without complication ([place])   . Hyperlipidemia   . Hypertension   . Macular degeneration   . PCOS  (polycystic ovarian syndrome)   . Proteinuria    Past Surgical History:  Procedure Laterality Date  . PARTIAL HYSTERECTOMY      FAMILY HISTORY Family History  Problem Relation Age of Onset  . Hyperlipidemia Father   . Hypertension Father     SOCIAL HISTORY Social History   Tobacco Use  . Smoking status: Never Smoker  . Smokeless tobacco: Never Used  Substance Use Topics  . Alcohol use: Not on file  . Drug use: Not on file         OPHTHALMIC EXAM:  Base Eye Exam    Visual Acuity (Snellen - Linear)      Right Left   Dist cc 20/200 20/40-1   Dist ph cc NI 20/30   Correction: Contacts       Tonometry (Tonopen, 2:46 PM)      Right Left   Pressure 14 12       Pupils      Pupils Dark Light Shape React APD   Right PERRL 4 3 Round Slow None   Left PERRL 4 3 Round Slow None       Visual Fields (Counting fingers)      Left Right    Full Full       Extraocular Movement  Right Left    Full Full       Neuro/Psych    Oriented x3: Yes   Mood/Affect: Normal       Dilation    Left eye: 1.0% Mydriacyl, 2.5% Phenylephrine @ 2:46 PM        Slit Lamp and Fundus Exam    External Exam      Right Left   External Normal Normal       Slit Lamp Exam      Right Left   Lids/Lashes Normal Normal   Conjunctiva/Sclera White and quiet White and quiet   Cornea Clear Clear   Anterior Chamber Deep and quiet Deep and quiet   Iris Round and reactive Round and reactive   Lens  1+ Nuclear sclerosis   Anterior Vitreous Normal Normal       Fundus Exam      Right Left   Posterior Vitreous  Posterior vitreous detachment, Central vitreous floaters   Disc  Peripapillary atrophy   C/D Ratio  0.35   Macula  Geographic atrophy   Vessels  Normal   Periphery  Normal,,          IMAGING AND PROCEDURES  Imaging and Procedures for 05/17/20  OCT, Retina - OU - Both Eyes       Right Eye Quality was good. Scan locations included subfoveal. Central Foveal  Thickness: 305. Progression has been stable. Findings include abnormal foveal contour, disciform scar.   Left Eye Quality was good. Scan locations included subfoveal. Central Foveal Thickness: 323. Progression has been stable. Findings include choroidal neovascular membrane, outer retinal atrophy.   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  Improved and stabilized on intravitreal Eylea, currently at 6-week interval.  We will repeat today and again in 6 weeks                ASSESSMENT/PLAN:  No problem-specific Assessment & Plan notes found for this encounter.      ICD-10-CM   1. 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  Improved and stabilized on intravitreal Eylea, currently at 6-week interval.  We will repeat today and again in 6 weeks  2.  3.  Ophthalmic Meds Ordered this visit:  No orders of the defined types were placed in this encounter.      Return in about 6 weeks (around 06/28/2020) for dilate, OS, EYLEA OCT.  There are no Patient Instructions on file for this visit.   Explained the diagnoses, plan, and follow up with the patient and they expressed understanding.  Patient expressed understanding of the importance of proper follow up care.   Alford Highland Yaron Grasse M.D. Diseases & Surgery of the Retina and Vitreous [facility] 05/17/20     Abbreviations: M myopia (nearsighted); A astigmatism; H hyperopia (farsighted); P presbyopia; Mrx spectacle prescription;  CTL contact lenses; OD right eye; OS left eye; OU both eyes  XT exotropia; ET esotropia; PEK punctate epithelial keratitis; PEE punctate epithelial erosions; DES dry eye syndrome; MGD meibomian gland dysfunction; ATs artificial tears; PFAT's preservative free artificial tears; NSC  nuclear sclerotic cataract; PSC posterior subcapsular cataract; ERM epi-retinal membrane; PVD posterior vitreous detachment; RD retinal detachment; DM diabetes mellitus; DR diabetic retinopathy; NPDR non-proliferative diabetic retinopathy; PDR proliferative diabetic retinopathy; CSME  clinically significant macular edema; DME diabetic macular edema; dbh dot blot hemorrhages; CWS cotton wool spot; POAG primary open angle glaucoma; C/D cup-to-disc ratio; HVF humphrey visual field; GVF goldmann visual field; OCT optical coherence tomography; IOP intraocular pressure; BRVO Branch retinal vein occlusion; CRVO central retinal vein occlusion; CRAO central retinal artery occlusion; BRAO branch retinal artery occlusion; RT retinal tear; SB scleral buckle; PPV pars plana vitrectomy; VH Vitreous hemorrhage; PRP panretinal laser photocoagulation; IVK intravitreal kenalog; VMT vitreomacular traction; MH Macular hole;  NVD neovascularization of the disc; NVE neovascularization elsewhere; AREDS age related eye disease study; ARMD age related macular degeneration; POAG primary open angle glaucoma; EBMD epithelial/anterior basement membrane dystrophy; ACIOL anterior chamber intraocular lens; IOL intraocular lens; PCIOL posterior chamber intraocular lens; Phaco/IOL phacoemulsification with intraocular lens placement; [place] photorefractive keratectomy; LASIK laser assisted in situ keratomileusis; HTN hypertension; DM diabetes mellitus; COPD chronic obstructive pulmonary disease

## 2020-06-28 ENCOUNTER — Ambulatory Visit (INDEPENDENT_AMBULATORY_CARE_PROVIDER_SITE_OTHER): Payer: BC Managed Care – PPO | Admitting: Ophthalmology

## 2020-06-28 ENCOUNTER — Other Ambulatory Visit: Payer: Self-pay

## 2020-06-28 ENCOUNTER — Encounter (INDEPENDENT_AMBULATORY_CARE_PROVIDER_SITE_OTHER): Payer: Self-pay | Admitting: Ophthalmology

## 2020-06-28 ENCOUNTER — Encounter (INDEPENDENT_AMBULATORY_CARE_PROVIDER_SITE_OTHER): Payer: BC Managed Care – PPO | Admitting: Ophthalmology

## 2020-06-28 DIAGNOSIS — H353221 Exudative age-related macular degeneration, left eye, with active choroidal neovascularization: Secondary | ICD-10-CM | POA: Diagnosis not present

## 2020-06-28 MED ORDER — AFLIBERCEPT 2MG/0.05ML IZ SOLN FOR KALEIDOSCOPE
2.0000 mg | INTRAVITREAL | Status: AC | PRN
  Administered 2020-06-28: 2 mg via INTRAVITREAL

## 2020-06-28 NOTE — Assessment & Plan Note (Signed)
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

## 2020-06-28 NOTE — Progress Notes (Signed)
06/28/2020     CHIEF COMPLAINT Patient presents for Retina Follow Up   HISTORY OF PRESENT ILLNESS: Cristina Moore is a 49 y.o. female who presents to the clinic today for:   HPI    Retina Follow Up    Patient presents with  Wet AMD.  In left eye.  Duration of 6 weeks.  Since onset it is stable.          Comments    6 week follow up - OCT OU, Poss Eylea OS Patient denies change in vision and overall has no complaints.        Last edited by Berenice Bouton on 06/28/2020  2:20 PM. (History)      Referring physician: Maurice Small, MD [address] [place],  [place] [address]  HISTORICAL INFORMATION:   Selected notes from the MEDICAL RECORD NUMBER       CURRENT MEDICATIONS: No current outpatient medications on file. (Ophthalmic Drugs)   No current facility-administered medications for this visit. (Ophthalmic Drugs)   Current Outpatient Medications (Other)  Medication Sig  . aspirin 81 MG tablet Take 81 mg by mouth daily.  Marland Kitchen atorvastatin (LIPITOR) 40 MG tablet Take 40 mg by mouth daily.  Marland Kitchen buPROPion (ZYBAN) 150 MG 12 hr tablet Take 150 mg by mouth 2 (two) times daily.  Marland Kitchen glimepiride (AMARYL) 2 MG tablet Take 2 mg by mouth daily with breakfast.  . lisinopril (PRINIVIL,ZESTRIL) 20 MG tablet Take 20 mg by mouth daily.  . metFORMIN (GLUCOPHAGE) 500 MG tablet Take by mouth 2 (two) times daily with a meal. Takes 1& 1/2 tables twice daily with meals  . Multiple Vitamin (MULTI-VITAMIN DAILY PO) Take by mouth.   No current facility-administered medications for this visit. (Other)      REVIEW OF SYSTEMS:    ALLERGIES No Known Allergies  PAST MEDICAL HISTORY Past Medical History:  Diagnosis Date  . Depression   . Diabetes mellitus without complication (HCC)   . Hyperlipidemia   . Hypertension   . Macular degeneration   . PCOS (polycystic ovarian syndrome)   . Proteinuria    Past Surgical History:  Procedure Laterality Date  . PARTIAL  HYSTERECTOMY      FAMILY HISTORY Family History  Problem Relation Age of Onset  . Hyperlipidemia Father   . Hypertension Father     SOCIAL HISTORY Social History   Tobacco Use  . Smoking status: Never Smoker  . Smokeless tobacco: Never Used  Substance Use Topics  . Alcohol use: Not on file  . Drug use: Not on file         OPHTHALMIC EXAM:  Base Eye Exam    Visual Acuity (Snellen - Linear)      Right Left   Dist cc 20/200 20/30+1   Dist ph cc NI    Correction: Contacts       Tonometry (Tonopen, 2:28 PM)      Right Left   Pressure 14 15       Pupils      Pupils Dark Light Shape React APD   Right PERRL 4 3 Round Slow None   Left PERRL 4 3 Round Slow None       Visual Fields (Counting fingers)      Left Right    Full Full       Extraocular Movement      Right Left    Full Full       Neuro/Psych  Oriented x3: Yes   Mood/Affect: Normal       Dilation    Left eye: 1.0% Mydriacyl, 2.5% Phenylephrine @ 2:28 PM        Slit Lamp and Fundus Exam    External Exam      Right Left   External Normal Normal       Slit Lamp Exam      Right Left   Lids/Lashes Normal Normal   Conjunctiva/Sclera White and quiet White and quiet   Cornea Clear Clear   Anterior Chamber Deep and quiet Deep and quiet   Iris Round and reactive Round and reactive   Lens  1+ Nuclear sclerosis   Anterior Vitreous Normal Normal       Fundus Exam      Right Left   Posterior Vitreous  Posterior vitreous detachment, Central vitreous floaters   Disc  Peripapillary atrophy   C/D Ratio  0.35   Macula  Geographic atrophy   Vessels  Normal   Periphery  Normal,,          IMAGING AND PROCEDURES  Imaging and Procedures for 06/28/20  OCT, Retina - OU - Both Eyes       Right Eye Quality was good. Scan locations included subfoveal. Central Foveal Thickness: 263. Progression has been stable. Findings include abnormal foveal contour, disciform scar.   Left Eye Quality was  good. Scan locations included subfoveal. Central Foveal Thickness: 305. Progression has improved. Findings include abnormal foveal contour, outer retinal atrophy, central retinal atrophy.   Notes OS, with less active CNVM nasal to the fovea, less intrusion into the retina.,  No intraretinal fluid remains.  Currently at 6-week interval.  We will repeat intravitreal Eylea today and examination in 6 weeks       Intravitreal Injection, Pharmacologic Agent - OS - Left Eye       Time Out 06/28/2020. 3:08 PM. Confirmed correct patient, procedure, site, and patient consented.   Anesthesia Topical anesthesia was used. Anesthetic medications included Akten 3.5%.   Procedure Preparation included 5% betadine to ocular surface, 10% betadine to eyelids, Tobramycin 0.3%. A 30 gauge needle was used.   Injection:  2 mg aflibercept Gretta Cool) SOLN   NDC: L6038910, Lot: [id number]   Route: Intravitreal, Site: Left Eye, Waste: 0 mg  Post-op Post injection exam found visual acuity of at least counting fingers. The patient tolerated the procedure well. There were no complications. The patient received written and verbal post procedure care education. Post injection medications were not given.                 ASSESSMENT/PLAN:  Exudative age-related macular degeneration of left eye with active choroidal neovascularization (HCC) Monocular patient with history of multiple recurrences OS juxta foveal, will need repeat intravitreal Eylea OS today and maintain 6-week interval with very slow extension of exam intervals in the future      ICD-10-CM   1. Exudative age-related macular degeneration of left eye with active choroidal neovascularization (HCC)  H35.3221 OCT, Retina - OU - Both Eyes    Intravitreal Injection, Pharmacologic Agent - OS - Left Eye    aflibercept (EYLEA) SOLN 2 mg    1.OS, with less active CNVM nasal to the fovea, less intrusion into the retina.,  No intraretinal fluid remains.   Currently at 6-week interval.  We will repeat intravitreal Eylea today and examination in 6 weeks  2.  3.  Ophthalmic Meds Ordered this visit:  Meds ordered this encounter  Medications  . aflibercept (EYLEA) SOLN 2 mg       Return in about 6 weeks (around 08/09/2020) for dilate, OS, EYLEA OCT.  There are no Patient Instructions on file for this visit.   Explained the diagnoses, plan, and follow up with the patient and they expressed understanding.  Patient expressed understanding of the importance of proper follow up care.   Alford Highland Delayla Hoffmaster M.D. Diseases & Surgery of the Retina and Vitreous [facility] 06/28/20     Abbreviations: M myopia (nearsighted); A astigmatism; H hyperopia (farsighted); P presbyopia; Mrx spectacle prescription;  CTL contact lenses; OD right eye; OS left eye; OU both eyes  XT exotropia; ET esotropia; PEK punctate epithelial keratitis; PEE punctate epithelial erosions; DES dry eye syndrome; MGD meibomian gland dysfunction; ATs artificial tears; PFAT's preservative free artificial tears; NSC nuclear sclerotic cataract; PSC posterior subcapsular cataract; ERM epi-retinal membrane; PVD posterior vitreous detachment; RD retinal detachment; DM diabetes mellitus; DR diabetic retinopathy; NPDR non-proliferative diabetic retinopathy; PDR proliferative diabetic retinopathy; CSME clinically significant macular edema; DME diabetic macular edema; dbh dot blot hemorrhages; CWS cotton wool spot; POAG primary open angle glaucoma; C/D cup-to-disc ratio; HVF humphrey visual field; GVF goldmann visual field; OCT optical coherence tomography; IOP intraocular pressure; BRVO Branch retinal vein occlusion; CRVO central retinal vein occlusion; CRAO central retinal artery occlusion; BRAO branch retinal artery occlusion; RT retinal tear; SB scleral buckle; PPV pars plana vitrectomy; VH Vitreous hemorrhage; PRP panretinal laser photocoagulation; IVK intravitreal kenalog; VMT  vitreomacular traction; MH Macular hole;  NVD neovascularization of the disc; NVE neovascularization elsewhere; AREDS age related eye disease study; ARMD age related macular degeneration; POAG primary open angle glaucoma; EBMD epithelial/anterior basement membrane dystrophy; ACIOL anterior chamber intraocular lens; IOL intraocular lens; PCIOL posterior chamber intraocular lens; Phaco/IOL phacoemulsification with intraocular lens placement; PRK photorefractive keratectomy; LASIK laser assisted in situ keratomileusis; HTN hypertension; DM diabetes mellitus; COPD chronic obstructive pulmonary disease

## 2020-08-09 ENCOUNTER — Encounter (INDEPENDENT_AMBULATORY_CARE_PROVIDER_SITE_OTHER): Payer: Self-pay | Admitting: Ophthalmology

## 2020-08-09 ENCOUNTER — Other Ambulatory Visit: Payer: Self-pay

## 2020-08-09 ENCOUNTER — Ambulatory Visit (INDEPENDENT_AMBULATORY_CARE_PROVIDER_SITE_OTHER): Payer: BC Managed Care – PPO | Admitting: Ophthalmology

## 2020-08-09 DIAGNOSIS — H353221 Exudative age-related macular degeneration, left eye, with active choroidal neovascularization: Secondary | ICD-10-CM

## 2020-08-09 DIAGNOSIS — H43812 Vitreous degeneration, left eye: Secondary | ICD-10-CM

## 2020-08-09 MED ORDER — AFLIBERCEPT 2MG/0.05ML IZ SOLN FOR KALEIDOSCOPE
2.0000 mg | INTRAVITREAL | Status: AC | PRN
  Administered 2020-08-09: 2 mg via INTRAVITREAL

## 2020-08-09 NOTE — Patient Instructions (Signed)
Patient to report any new onset visual acuity declines or distortions °

## 2020-08-09 NOTE — Assessment & Plan Note (Signed)
Perifoveal CNVM much less active and now inactive at 6-week interval on intravitreal Eylea OS we will repeat injection today and extend interval of examination Avastin 7 weeks

## 2020-08-09 NOTE — Progress Notes (Signed)
08/09/2020     CHIEF COMPLAINT Patient presents for Retina Follow Up   HISTORY OF PRESENT ILLNESS: Cristina Moore is a 49 y.o. female who presents to the clinic today for:   HPI    Retina Follow Up    Patient presents with  Wet AMD.  In left eye.  Severity is moderate.  Duration of 6 weeks.  Since onset it is stable.  I, the attending physician,  performed the HPI with the patient and updated documentation appropriately.          Comments    6 Week Wet AMD f\u OS. Possible Eylea OS. OCT  Pt states vision is about the same. Pt states she sees multiple floaters in OS which hinders vision.  BGL: 120       Last edited by Elyse Jarvis on 08/09/2020  2:38 PM. (History)      Referring physician: Maurice Small, MD [address] [place],  [place] [address]  HISTORICAL INFORMATION:   Selected notes from the MEDICAL RECORD NUMBER       CURRENT MEDICATIONS: No current outpatient medications on file. (Ophthalmic Drugs)   No current facility-administered medications for this visit. (Ophthalmic Drugs)   Current Outpatient Medications (Other)  Medication Sig  . aspirin 81 MG tablet Take 81 mg by mouth daily.  Marland Kitchen atorvastatin (LIPITOR) 40 MG tablet Take 40 mg by mouth daily.  Marland Kitchen buPROPion (ZYBAN) 150 MG 12 hr tablet Take 150 mg by mouth 2 (two) times daily.  Marland Kitchen glimepiride (AMARYL) 2 MG tablet Take 2 mg by mouth daily with breakfast.  . lisinopril (PRINIVIL,ZESTRIL) 20 MG tablet Take 20 mg by mouth daily.  . metFORMIN (GLUCOPHAGE) 500 MG tablet Take by mouth 2 (two) times daily with a meal. Takes 1& 1/2 tables twice daily with meals  . Multiple Vitamin (MULTI-VITAMIN DAILY PO) Take by mouth.   No current facility-administered medications for this visit. (Other)      REVIEW OF SYSTEMS: ROS    Positive for: Endocrine   Last edited by Elyse Jarvis on 08/09/2020  2:38 PM. (History)       ALLERGIES No Known Allergies  PAST MEDICAL HISTORY Past  Medical History:  Diagnosis Date  . Depression   . Diabetes mellitus without complication (HCC)   . Hyperlipidemia   . Hypertension   . Macular degeneration   . PCOS (polycystic ovarian syndrome)   . Proteinuria    Past Surgical History:  Procedure Laterality Date  . PARTIAL HYSTERECTOMY      FAMILY HISTORY Family History  Problem Relation Age of Onset  . Hyperlipidemia Father   . Hypertension Father     SOCIAL HISTORY Social History   Tobacco Use  . Smoking status: Never Smoker  . Smokeless tobacco: Never Used  Substance Use Topics  . Alcohol use: Not on file  . Drug use: Not on file         OPHTHALMIC EXAM:  Base Eye Exam    Visual Acuity (Snellen - Linear)      Right Left   Dist cc 20/200 20/40   Dist ph cc NI 20/25   Correction: Contacts       Tonometry (Tonopen, 2:44 PM)      Right Left   Pressure 14 14       Pupils      Pupils Dark Light Shape React APD   Right PERRL 4 3 Round Brisk None   Left PERRL  4 3 Round Slow None       Visual Fields (Counting fingers)      Left Right    Full Full       Neuro/Psych    Oriented x3: Yes   Mood/Affect: Normal       Dilation    Left eye: 1.0% Mydriacyl, 2.5% Phenylephrine @ 2:45 PM        Slit Lamp and Fundus Exam    External Exam      Right Left   External Normal Normal       Slit Lamp Exam      Right Left   Lids/Lashes Normal Normal   Conjunctiva/Sclera White and quiet White and quiet   Cornea Clear Clear   Anterior Chamber Deep and quiet Deep and quiet   Iris Round and reactive Round and reactive   Lens  1+ Nuclear sclerosis   Anterior Vitreous Normal Normal       Fundus Exam      Right Left   Posterior Vitreous  Posterior vitreous detachment, Central vitreous floaters   Disc  Peripapillary atrophy   C/D Ratio  0.35   Macula  Geographic atrophy   Vessels  Normal   Periphery  Normal,,          IMAGING AND PROCEDURES  Imaging and Procedures for 08/09/20  OCT, Retina - OU  - Both Eyes       Right Eye Quality was good. Scan locations included subfoveal. Central Foveal Thickness: 239. Progression has been stable. Findings include abnormal foveal contour, disciform scar.   Left Eye Quality was good. Scan locations included subfoveal. Central Foveal Thickness: 386. Progression has improved. Findings include abnormal foveal contour, outer retinal atrophy, central retinal atrophy.   Notes OS, with less active CNVM nasal to the fovea, less intrusion into the retina.,  No intraretinal fluid remains.  Currently at 6-week interval.  We will repeat intravitreal Eylea today and examination in 6 weeks       Intravitreal Injection, Pharmacologic Agent - OS - Left Eye       Time Out 08/09/2020. 3:59 PM. Confirmed correct patient, procedure, site, and patient consented.   Anesthesia Topical anesthesia was used. Anesthetic medications included Akten 3.5%.   Procedure Preparation included 5% betadine to ocular surface, 10% betadine to eyelids, Tobramycin 0.3%. A 30 gauge needle was used.   Injection:  2 mg aflibercept Gretta Cool) SOLN   NDC: L6038910, Lot: [id number]   Route: Intravitreal, Site: Left Eye, Waste: 0 mg  Post-op Post injection exam found visual acuity of at least counting fingers. The patient tolerated the procedure well. There were no complications. The patient received written and verbal post procedure care education. Post injection medications were not given.                 ASSESSMENT/PLAN:  Exudative age-related macular degeneration of left eye with active choroidal neovascularization (HCC) Perifoveal CNVM much less active and now inactive at 6-week interval on intravitreal Eylea OS we will repeat injection today and extend interval of examination Avastin 7 weeks  Posterior vitreous detachment of left eye   The nature of posterior vitreous detachment was discussed with the patient as well as its physiology, its age prevalence, and its  possible implication regarding retinal breaks and detachment.  An informational brochure was given to the patient.  All the patient's questions were answered.  The patient was asked to return if new or different flashes or floaters develops.   Patient  was instructed to contact office immediately if any changes were noticed. I explained to the patient that vitreous inside the eye is similar to jello inside a bowl. As the jello melts it can start to pull away from the bowl, similarly the vitreous throughout our lives can begin to pull away from the retina. That process is called a posterior vitreous detachment. In some cases, the vitreous can tug hard enough on the retina to form a retinal tear. I discussed with the patient the signs and symptoms of a retinal detachment.  Do not rub the eye.      ICD-10-CM   1. Exudative age-related macular degeneration of left eye with active choroidal neovascularization (HCC)  H35.3221 OCT, Retina - OU - Both Eyes    Intravitreal Injection, Pharmacologic Agent - OS - Left Eye    aflibercept (EYLEA) SOLN 2 mg  2. Posterior vitreous detachment of left eye  H43.812     1.  Repeat intravitreal Eylea OS today and examination in 7 weeks  2.  3.  Ophthalmic Meds Ordered this visit:  Meds ordered this encounter  Medications  . aflibercept (EYLEA) SOLN 2 mg       Return in about 7 weeks (around 09/27/2020) for dilate, OS, EYLEA OCT.  Patient Instructions  Patient to report any new onset visual acuity declines or distortions    Explained the diagnoses, plan, and follow up with the patient and they expressed understanding.  Patient expressed understanding of the importance of proper follow up care.   Alford Highland Romone Shaff M.D. Diseases & Surgery of the Retina and Vitreous [facility] 08/09/20     Abbreviations: M myopia (nearsighted); A astigmatism; H hyperopia (farsighted); P presbyopia; Mrx spectacle prescription;  CTL contact lenses; OD  right eye; OS left eye; OU both eyes  XT exotropia; ET esotropia; PEK punctate epithelial keratitis; PEE punctate epithelial erosions; DES dry eye syndrome; MGD meibomian gland dysfunction; ATs artificial tears; PFAT's preservative free artificial tears; NSC nuclear sclerotic cataract; PSC posterior subcapsular cataract; ERM epi-retinal membrane; PVD posterior vitreous detachment; RD retinal detachment; DM diabetes mellitus; DR diabetic retinopathy; NPDR non-proliferative diabetic retinopathy; PDR proliferative diabetic retinopathy; CSME clinically significant macular edema; DME diabetic macular edema; dbh dot blot hemorrhages; CWS cotton wool spot; POAG primary open angle glaucoma; C/D cup-to-disc ratio; HVF humphrey visual field; GVF goldmann visual field; OCT optical coherence tomography; IOP intraocular pressure; BRVO Branch retinal vein occlusion; CRVO central retinal vein occlusion; CRAO central retinal artery occlusion; BRAO branch retinal artery occlusion; RT retinal tear; SB scleral buckle; PPV pars plana vitrectomy; VH Vitreous hemorrhage; PRP panretinal laser photocoagulation; IVK intravitreal kenalog; VMT vitreomacular traction; MH Macular hole;  NVD neovascularization of the disc; NVE neovascularization elsewhere; AREDS age related eye disease study; ARMD age related macular degeneration; POAG primary open angle glaucoma; EBMD epithelial/anterior basement membrane dystrophy; ACIOL anterior chamber intraocular lens; IOL intraocular lens; PCIOL posterior chamber intraocular lens; Phaco/IOL phacoemulsification with intraocular lens placement; PRK photorefractive keratectomy; LASIK laser assisted in situ keratomileusis; HTN hypertension; DM diabetes mellitus; COPD chronic obstructive pulmonary disease

## 2020-08-09 NOTE — Assessment & Plan Note (Signed)

## 2020-10-01 ENCOUNTER — Encounter (INDEPENDENT_AMBULATORY_CARE_PROVIDER_SITE_OTHER): Payer: BC Managed Care – PPO | Admitting: Ophthalmology

## 2020-10-01 ENCOUNTER — Ambulatory Visit (INDEPENDENT_AMBULATORY_CARE_PROVIDER_SITE_OTHER): Payer: BC Managed Care – PPO | Admitting: Ophthalmology

## 2020-10-01 ENCOUNTER — Other Ambulatory Visit: Payer: Self-pay

## 2020-10-01 ENCOUNTER — Encounter (INDEPENDENT_AMBULATORY_CARE_PROVIDER_SITE_OTHER): Payer: Self-pay | Admitting: Ophthalmology

## 2020-10-01 DIAGNOSIS — H3530 Unspecified macular degeneration: Secondary | ICD-10-CM | POA: Diagnosis not present

## 2020-10-01 DIAGNOSIS — H353221 Exudative age-related macular degeneration, left eye, with active choroidal neovascularization: Secondary | ICD-10-CM

## 2020-10-01 DIAGNOSIS — H43812 Vitreous degeneration, left eye: Secondary | ICD-10-CM

## 2020-10-01 MED ORDER — AFLIBERCEPT 2MG/0.05ML IZ SOLN FOR KALEIDOSCOPE
2.0000 mg | INTRAVITREAL | Status: AC | PRN
  Administered 2020-10-01: 2 mg via INTRAVITREAL

## 2020-10-01 NOTE — Assessment & Plan Note (Signed)
  OS With subretinal scarring yet no new or recurrent intraretinal or subretinal fluid.  Currently at 7-week interval with for recurrent CNVM in the past will repeat injection Eylea today and extend interval examination to 8 weeks

## 2020-10-01 NOTE — Progress Notes (Signed)
10/01/2020     CHIEF COMPLAINT Patient presents for Retina Follow Up   HISTORY OF PRESENT ILLNESS: Cristina Moore is a 48 y.o. female who presents to the clinic today for:   HPI    Retina Follow Up    Patient presents with  Wet AMD.  In left eye.  Severity is moderate.  Duration of 7 weeks.  Since onset it is stable.  I, the attending physician,  performed the HPI with the patient and updated documentation appropriately.          Comments    7 Week Wet AMD f\u OS. Possible Eylea OS. OCT  Pt states no changes in vision. Denies any complaints. BGL: 128       Last edited by Elyse Jarvis on 10/01/2020  2:00 PM. (History)      Referring physician: Maurice Small, MD [address] [place],  [place] [address]  HISTORICAL INFORMATION:   Selected notes from the MEDICAL RECORD NUMBER       CURRENT MEDICATIONS: No current outpatient medications on file. (Ophthalmic Drugs)   No current facility-administered medications for this visit. (Ophthalmic Drugs)   Current Outpatient Medications (Other)  Medication Sig  . aspirin 81 MG tablet Take 81 mg by mouth daily.  Marland Kitchen atorvastatin (LIPITOR) 40 MG tablet Take 40 mg by mouth daily.  Marland Kitchen buPROPion (ZYBAN) 150 MG 12 hr tablet Take 150 mg by mouth 2 (two) times daily.  Marland Kitchen glimepiride (AMARYL) 2 MG tablet Take 2 mg by mouth daily with breakfast.  . lisinopril (PRINIVIL,ZESTRIL) 20 MG tablet Take 20 mg by mouth daily.  . metFORMIN (GLUCOPHAGE) 500 MG tablet Take by mouth 2 (two) times daily with a meal. Takes 1& 1/2 tables twice daily with meals  . Multiple Vitamin (MULTI-VITAMIN DAILY PO) Take by mouth.   No current facility-administered medications for this visit. (Other)      REVIEW OF SYSTEMS: ROS    Positive for: Endocrine   Last edited by Elyse Jarvis on 10/01/2020  2:00 PM. (History)       ALLERGIES No Known Allergies  PAST MEDICAL HISTORY Past Medical History:  Diagnosis Date  . Depression    . Diabetes mellitus without complication (HCC)   . Hyperlipidemia   . Hypertension   . Macular degeneration   . PCOS (polycystic ovarian syndrome)   . Proteinuria    Past Surgical History:  Procedure Laterality Date  . PARTIAL HYSTERECTOMY      FAMILY HISTORY Family History  Problem Relation Age of Onset  . Hyperlipidemia Father   . Hypertension Father     SOCIAL HISTORY Social History   Tobacco Use  . Smoking status: Never Smoker  . Smokeless tobacco: Never Used  Substance Use Topics  . Alcohol use: Not on file  . Drug use: Not on file         OPHTHALMIC EXAM:  Base Eye Exam    Visual Acuity (Snellen - Linear)      Right Left   Dist cc 20/200 20/40   Dist ph cc NI 20/20 -2   Correction: Contacts       Tonometry (Tonopen, 2:07 PM)      Right Left   Pressure 13 15       Pupils      Pupils Dark Light Shape React APD   Right PERRL 4 3 Round Brisk None   Left PERRL 4 3 Round Brisk None  Visual Fields (Counting fingers)      Left Right    Full Full       Neuro/Psych    Oriented x3: Yes   Mood/Affect: Normal       Dilation    Left eye: 1.0% Mydriacyl, 2.5% Phenylephrine @ 2:07 PM        Slit Lamp and Fundus Exam    External Exam      Right Left   External Normal Normal       Slit Lamp Exam      Right Left   Lids/Lashes Normal Normal   Conjunctiva/Sclera White and quiet White and quiet   Cornea Clear Clear   Anterior Chamber Deep and quiet Deep and quiet   Iris Round and reactive Round and reactive   Lens  1+ Nuclear sclerosis   Anterior Vitreous Normal Normal       Fundus Exam      Right Left   Posterior Vitreous  Posterior vitreous detachment, Central vitreous floaters   Disc  Peripapillary atrophy   C/D Ratio  0.35   Macula  Geographic atrophy   Vessels  Normal   Periphery  Normal,,          IMAGING AND PROCEDURES  Imaging and Procedures for 10/01/20  OCT, Retina - OU - Both Eyes       Right Eye Quality was  good. Scan locations included subfoveal. Central Foveal Thickness: 247. Progression has been stable. Findings include abnormal foveal contour, outer retinal atrophy, subretinal scarring.   Left Eye Quality was good. Scan locations included subfoveal. Central Foveal Thickness: 350. Progression has improved. Findings include no SRF, no IRF, subretinal hyper-reflective material, subretinal scarring.   Notes Macular RPE atrophy centrally OD with old subretinal fibrosis stable and not changing.  OS With subretinal scarring yet no new or recurrent intraretinal or subretinal fluid.  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 2:36 PM. Confirmed correct patient, procedure, site, and patient consented.   Anesthesia Topical anesthesia was used. Anesthetic medications included Akten 3.5%.   Procedure Preparation included Tobramycin 0.3%, 5% betadine to ocular surface, 10% betadine to eyelids. A 30 gauge needle was used.   Injection:  2 mg aflibercept Gretta Cool) SOLN   NDC: 28366-294-76   Route: Intravitreal, Site: Left Eye, Waste: 0 mg  Post-op Post injection exam found visual acuity of at least counting fingers. The patient tolerated the procedure well. There were no complications. The patient received written and verbal post procedure care education.                 ASSESSMENT/PLAN:  Exudative age-related macular degeneration of left eye with active choroidal neovascularization (HCC)  OS With subretinal scarring yet no new or recurrent intraretinal or subretinal fluid.  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  An informational brochure was given to the patient.  All the patient's questions were answered.  The patient was asked to return if new  or different flashes or floaters develops.   Patient was instructed to contact office immediately if any changes were noticed. I explained to the patient that vitreous inside the eye is similar to jello inside a bowl. As the jello melts it can start to pull away from the bowl, similarly the vitreous throughout our lives can begin to pull away from the retina. That process is called a posterior vitreous detachment. In some cases, the vitreous can tug hard enough on the retina to form a retinal tear. I discussed with the patient the signs and symptoms of a retinal detachment.  Do not rub the eye.      ICD-10-CM   1. Exudative age-related macular degeneration of left eye with active choroidal neovascularization (HCC)  H35.3221 OCT, Retina - OU - Both Eyes    Intravitreal Injection, Pharmacologic Agent - OS - Left Eye    aflibercept (EYLEA) SOLN 2 mg  2. Posterior vitreous detachment of left eye  H43.812   3. Myopic macular degeneration of both eyes  H35.30     1.  OS, looks great at 7-week follow-up, with no active CNVM will continue to monitor and treat will extend current interval follow-up to 8 weeks and reevaluate  2.  No signs of active disease in the right eye.  3.  Ophthalmic Meds Ordered this visit:  Meds ordered this encounter  Medications  . aflibercept (EYLEA) SOLN 2 mg       Return in about 8 weeks (around 11/26/2020) for dilate, OS, EYLEA OCT.  Patient Instructions  Patient instructed to notify the office promptly new onset visual acuity decline or distortions    Explained the diagnoses, plan, and follow up with the patient and they expressed understanding.  Patient expressed  understanding of the importance of proper follow up care.   Alford Highland Jasper Hanf M.D. Diseases & Surgery of the Retina and Vitreous [facility] 10/01/20     Abbreviations: M myopia (nearsighted); A astigmatism; H hyperopia (farsighted); P presbyopia; Mrx spectacle prescription;  CTL contact lenses; OD right eye; OS left eye; OU both eyes  XT exotropia; ET esotropia; PEK punctate epithelial keratitis; PEE punctate epithelial erosions; DES dry eye syndrome; MGD meibomian gland dysfunction; ATs artificial tears; PFAT's preservative free artificial tears; NSC nuclear sclerotic cataract; PSC posterior subcapsular cataract; ERM epi-retinal membrane; PVD posterior vitreous detachment; RD retinal detachment; DM diabetes mellitus; DR diabetic retinopathy; NPDR non-proliferative diabetic retinopathy; PDR proliferative diabetic retinopathy; CSME clinically significant macular edema; DME diabetic macular edema; dbh dot blot hemorrhages; CWS cotton wool spot; POAG primary open angle glaucoma; C/D cup-to-disc ratio; HVF humphrey visual field; GVF goldmann visual field; OCT optical coherence tomography; IOP intraocular pressure; BRVO Branch retinal vein occlusion; CRVO central retinal vein occlusion; CRAO central retinal artery occlusion; BRAO branch retinal artery occlusion; RT retinal tear; SB scleral buckle; PPV pars plana vitrectomy; VH Vitreous hemorrhage; PRP panretinal laser photocoagulation; IVK intravitreal kenalog; VMT vitreomacular traction; MH Macular hole;  NVD neovascularization of the disc; NVE neovascularization elsewhere; AREDS age related eye disease study; ARMD age related macular degeneration; POAG primary open angle glaucoma; EBMD epithelial/anterior basement membrane dystrophy; ACIOL anterior chamber intraocular lens; IOL intraocular lens; PCIOL posterior chamber intraocular lens; Phaco/IOL phacoemulsification with intraocular lens placement; PRK photorefractive keratectomy; LASIK laser  assisted in situ keratomileusis; HTN hypertension; DM diabetes mellitus; COPD chronic obstructive pulmonary disease

## 2020-10-01 NOTE — Patient Instructions (Signed)
Patient instructed to notify the office promptly new onset visual acuity decline or distortions

## 2020-10-01 NOTE — Assessment & Plan Note (Signed)

## 2020-11-26 ENCOUNTER — Encounter (INDEPENDENT_AMBULATORY_CARE_PROVIDER_SITE_OTHER): Payer: BC Managed Care – PPO | Admitting: Ophthalmology

## 2020-12-17 ENCOUNTER — Encounter (INDEPENDENT_AMBULATORY_CARE_PROVIDER_SITE_OTHER): Payer: BC Managed Care – PPO | Admitting: Ophthalmology

## 2020-12-18 ENCOUNTER — Encounter (INDEPENDENT_AMBULATORY_CARE_PROVIDER_SITE_OTHER): Payer: Self-pay | Admitting: Ophthalmology

## 2020-12-18 ENCOUNTER — Encounter (INDEPENDENT_AMBULATORY_CARE_PROVIDER_SITE_OTHER): Payer: BC Managed Care – PPO | Admitting: Ophthalmology

## 2020-12-18 ENCOUNTER — Ambulatory Visit (INDEPENDENT_AMBULATORY_CARE_PROVIDER_SITE_OTHER): Payer: BC Managed Care – PPO | Admitting: Ophthalmology

## 2020-12-18 ENCOUNTER — Other Ambulatory Visit: Payer: Self-pay

## 2020-12-18 DIAGNOSIS — H353114 Nonexudative age-related macular degeneration, right eye, advanced atrophic with subfoveal involvement: Secondary | ICD-10-CM

## 2020-12-18 DIAGNOSIS — H353221 Exudative age-related macular degeneration, left eye, with active choroidal neovascularization: Secondary | ICD-10-CM | POA: Diagnosis not present

## 2020-12-18 MED ORDER — AFLIBERCEPT 2MG/0.05ML IZ SOLN FOR KALEIDOSCOPE
2.0000 mg | INTRAVITREAL | Status: AC | PRN
  Administered 2020-12-18: 2 mg via INTRAVITREAL

## 2020-12-18 NOTE — Assessment & Plan Note (Signed)
Stable by OCT examination no

## 2020-12-18 NOTE — Assessment & Plan Note (Signed)
Stable today at 11 weeks, 8 weeks was planned.  We will maintain 10 to 11-week interval because of history of recurrences

## 2020-12-18 NOTE — Progress Notes (Signed)
12/18/2020     CHIEF COMPLAINT Patient presents for Retina Follow Up (8 WK FU OS, POSS EYLEA OS///Pt reports vision stable OU, pt reports floaters stay constant in OS, no pain or pressure OU. ///Last A1C: 6.7 taken 08/2020///Last BS: 130 this AM )   HISTORY OF PRESENT ILLNESS: Cristina Moore is a 50 y.o. female who presents to the clinic today for:   HPI    Retina Follow Up    Patient presents with  Wet AMD.  In left eye.  This started 8 weeks ago.  Duration of 11 weeks.  Since onset it is stable. Additional comments: 8 WK FU OS, POSS EYLEA OS   Pt reports vision stable OU, pt reports floaters stay constant in OS, no pain or pressure OU.    Last A1C: 6.7 taken 08/2020   Last BS: 130 this AM        Last edited by Edmon Crape, MD on 12/18/2020  3:05 PM. (History)      Referring physician: Maurice Small, MD [address] [place],  [place] [address]  HISTORICAL INFORMATION:   Selected notes from the MEDICAL RECORD NUMBER       CURRENT MEDICATIONS: No current outpatient medications on file. (Ophthalmic Drugs)   No current facility-administered medications for this visit. (Ophthalmic Drugs)   Current Outpatient Medications (Other)  Medication Sig  . aspirin 81 MG tablet Take 81 mg by mouth daily.  Marland Kitchen atorvastatin (LIPITOR) 40 MG tablet Take 40 mg by mouth daily.  Marland Kitchen buPROPion (ZYBAN) 150 MG 12 hr tablet Take 150 mg by mouth 2 (two) times daily.  Marland Kitchen glimepiride (AMARYL) 2 MG tablet Take 2 mg by mouth daily with breakfast.  . lisinopril (PRINIVIL,ZESTRIL) 20 MG tablet Take 20 mg by mouth daily.  . metFORMIN (GLUCOPHAGE) 500 MG tablet Take by mouth 2 (two) times daily with a meal. Takes 1& 1/2 tables twice daily with meals  . Multiple Vitamin (MULTI-VITAMIN DAILY PO) Take by mouth.   No current facility-administered medications for this visit. (Other)      REVIEW OF SYSTEMS:    ALLERGIES No Known Allergies  PAST MEDICAL HISTORY Past Medical  History:  Diagnosis Date  . Depression   . Diabetes mellitus without complication (HCC)   . Hyperlipidemia   . Hypertension   . Macular degeneration   . PCOS (polycystic ovarian syndrome)   . Proteinuria    Past Surgical History:  Procedure Laterality Date  . PARTIAL HYSTERECTOMY      FAMILY HISTORY Family History  Problem Relation Age of Onset  . Hyperlipidemia Father   . Hypertension Father     SOCIAL HISTORY Social History   Tobacco Use  . Smoking status: Never Smoker  . Smokeless tobacco: Never Used         OPHTHALMIC EXAM: Base Eye Exam    Visual Acuity (ETDRS)      Right Left   Dist cc 20/200 20/50 +1   Dist ph cc 20/200 +1 20/30 +2   Correction: Contacts       Tonometry (Tonopen, 2:04 PM)      Right Left   Pressure 17 20       Pupils      Pupils Dark Light Shape React APD   Right PERRL 4 3 Round Brisk None   Left PERRL 4 3 Round Brisk None       Visual Fields (Counting fingers)      Left Right  Full Full       Extraocular Movement      Right Left    Full Full       Neuro/Psych    Oriented x3: Yes   Mood/Affect: Normal       Dilation    Left eye: 1.0% Mydriacyl, 2.5% Phenylephrine @ 2:04 PM        Slit Lamp and Fundus Exam    External Exam      Right Left   External Normal Normal       Slit Lamp Exam      Right Left   Lids/Lashes Normal Normal   Conjunctiva/Sclera White and quiet White and quiet   Cornea Clear Clear   Anterior Chamber Deep and quiet Deep and quiet   Iris Round and reactive Round and reactive   Lens 2+ Nuclear sclerosis 2+ Nuclear sclerosis   Anterior Vitreous Normal Normal       Fundus Exam      Right Left   Posterior Vitreous  Posterior vitreous detachment, Central vitreous floaters   Disc  Peripapillary atrophy   C/D Ratio  0.35   Macula  Geographic atrophy splits the FAZ, no hemorrhage, no macular thickening   Vessels  Normal   Periphery  Normal,,          IMAGING AND PROCEDURES   Imaging and Procedures for 12/18/20  OCT, Retina - OU - Both Eyes       Right Eye Quality was borderline. Scan locations included subfoveal. Findings include abnormal foveal contour, subretinal scarring.   Left Eye Quality was good. Scan locations included subfoveal. Findings include abnormal foveal contour, subretinal scarring.   Notes OS, juxta foveal  CNVM Scarring from associated  myopic disease and or histo   OD chronic disciform scar atrophy submacular no change        Intravitreal Injection, Pharmacologic Agent - OS - Left Eye       Time Out 12/18/2020. 3:08 PM. Confirmed correct patient, procedure, site, and patient consented.   Anesthesia Topical anesthesia was used. Anesthetic medications included Akten 3.5%.   Procedure Preparation included Tobramycin 0.3%, 5% betadine to ocular surface, 10% betadine to eyelids. A 30 gauge needle was used.   Injection:  2 mg aflibercept Gretta Cool) SOLN   NDC: L6038910, Lot: [id number]   Route: Intravitreal, Site: Left Eye, Waste: 0 mg  Post-op Post injection exam found visual acuity of at least counting fingers. The patient tolerated the procedure well. There were no complications. The patient received written and verbal post procedure care education.                 ASSESSMENT/PLAN:  Advanced nonexudative age-related macular degeneration of right eye with subfoveal involvement Stable by OCT examination no  Exudative age-related macular degeneration of left eye with active choroidal neovascularization (HCC) Stable today at 11 weeks, 8 weeks was planned.  We will maintain 10 to 11-week interval because of history of recurrences      ICD-10-CM   1. Exudative age-related macular degeneration of left eye with active choroidal neovascularization (HCC)  H35.3221 OCT, Retina - OU - Both Eyes    Intravitreal Injection, Pharmacologic Agent - OS - Left Eye    aflibercept (EYLEA) SOLN 2 mg  2. Advanced nonexudative  age-related macular degeneration of right eye with subfoveal involvement  H35.3114     1.  OS with history of chronic recurrence of CNVM related to a combination of myopic macular degeneration and prior histoplasmosis related  chorioretinal scarring.  We will repeat injection intravitreal Eylea today and this essentially monocular patient to maintain.  Currently at 11-week interval.  2.  3.  Ophthalmic Meds Ordered this visit:  Meds ordered this encounter  Medications  . aflibercept (EYLEA) SOLN 2 mg       Return in about 10 weeks (around 02/26/2021) for dilate, OS, EYLEA OCT.  There are no Patient Instructions on file for this visit.   Explained the diagnoses, plan, and follow up with the patient and they expressed understanding.  Patient expressed understanding of the importance of proper follow up care.   Alford Highland Breyon Sigg M.D. Diseases & Surgery of the Retina and Vitreous [facility] 12/18/20     Abbreviations: M myopia (nearsighted); A astigmatism; H hyperopia (farsighted); P presbyopia; Mrx spectacle prescription;  CTL contact lenses; OD right eye; OS left eye; OU both eyes  XT exotropia; ET esotropia; PEK punctate epithelial keratitis; PEE punctate epithelial erosions; DES dry eye syndrome; MGD meibomian gland dysfunction; ATs artificial tears; PFAT's preservative free artificial tears; NSC nuclear sclerotic cataract; PSC posterior subcapsular cataract; ERM epi-retinal membrane; PVD posterior vitreous detachment; RD retinal detachment; DM diabetes mellitus; DR diabetic retinopathy; NPDR non-proliferative diabetic retinopathy; PDR proliferative diabetic retinopathy; CSME clinically significant macular edema; DME diabetic macular edema; dbh dot blot hemorrhages; CWS cotton wool spot; POAG primary open angle glaucoma; C/D cup-to-disc ratio; HVF humphrey visual field; GVF goldmann visual field; OCT optical coherence tomography; IOP intraocular pressure; BRVO Branch  retinal vein occlusion; CRVO central retinal vein occlusion; CRAO central retinal artery occlusion; BRAO branch retinal artery occlusion; RT retinal tear; SB scleral buckle; PPV pars plana vitrectomy; VH Vitreous hemorrhage; PRP panretinal laser photocoagulation; IVK intravitreal kenalog; VMT vitreomacular traction; MH Macular hole;  NVD neovascularization of the disc; NVE neovascularization elsewhere; AREDS age related eye disease study; ARMD age related macular degeneration; POAG primary open angle glaucoma; EBMD epithelial/anterior basement membrane dystrophy; ACIOL anterior chamber intraocular lens; IOL intraocular lens; PCIOL posterior chamber intraocular lens; Phaco/IOL phacoemulsification with intraocular lens placement; PRK photorefractive keratectomy; LASIK laser assisted in situ keratomileusis; HTN hypertension; DM diabetes mellitus; COPD chronic obstructive pulmonary disease

## 2021-02-26 ENCOUNTER — Other Ambulatory Visit: Payer: Self-pay

## 2021-02-26 ENCOUNTER — Encounter (INDEPENDENT_AMBULATORY_CARE_PROVIDER_SITE_OTHER): Payer: Self-pay | Admitting: Ophthalmology

## 2021-02-26 ENCOUNTER — Ambulatory Visit (INDEPENDENT_AMBULATORY_CARE_PROVIDER_SITE_OTHER): Payer: BC Managed Care – PPO | Admitting: Ophthalmology

## 2021-02-26 DIAGNOSIS — B399 Histoplasmosis, unspecified: Secondary | ICD-10-CM | POA: Diagnosis not present

## 2021-02-26 DIAGNOSIS — H353221 Exudative age-related macular degeneration, left eye, with active choroidal neovascularization: Secondary | ICD-10-CM | POA: Diagnosis not present

## 2021-02-26 DIAGNOSIS — H32 Chorioretinal disorders in diseases classified elsewhere: Secondary | ICD-10-CM | POA: Diagnosis not present

## 2021-02-26 MED ORDER — AFLIBERCEPT 2MG/0.05ML IZ SOLN FOR KALEIDOSCOPE
2.0000 mg | INTRAVITREAL | Status: AC | PRN
  Administered 2021-02-26: 2 mg via INTRAVITREAL

## 2021-02-26 NOTE — Progress Notes (Signed)
02/26/2021     CHIEF COMPLAINT Patient presents for Retina Follow Up (10 Wk F/U OS, poss Eylea OS//Pt denies noticeable changes to [place] OU since last visit. Pt denies ocular pain, flashes of light, or floaters OU. //)   HISTORY OF PRESENT ILLNESS: Cristina Moore is a 50 y.o. female who presents to the clinic today for:   HPI    Retina Follow Up    Patient presents with  Wet AMD.  In left eye.  This started 10 weeks ago.  Severity is mild.  Duration of 10 weeks.  Since onset it is stable. Additional comments: 10 Wk F/U OS, poss Eylea OS  Pt denies noticeable changes to [place] OU since last visit. Pt denies ocular pain, flashes of light, or floaters OU.          Last edited by Ileana Roup, COA on 02/26/2021  2:07 PM. (History)      Referring physician: Maurice Small, MD [address] [place],  [place] [address]  HISTORICAL INFORMATION:   Selected notes from the MEDICAL RECORD NUMBER       CURRENT MEDICATIONS: No current outpatient medications on file. (Ophthalmic Drugs)   No current facility-administered medications for this visit. (Ophthalmic Drugs)   Current Outpatient Medications (Other)  Medication Sig  . aspirin 81 MG tablet Take 81 mg by mouth daily.  Marland Kitchen atorvastatin (LIPITOR) 40 MG tablet Take 40 mg by mouth daily.  Marland Kitchen buPROPion (ZYBAN) 150 MG 12 hr tablet Take 150 mg by mouth 2 (two) times daily.  Marland Kitchen glimepiride (AMARYL) 2 MG tablet Take 2 mg by mouth daily with breakfast.  . lisinopril (PRINIVIL,ZESTRIL) 20 MG tablet Take 20 mg by mouth daily.  . metFORMIN (GLUCOPHAGE) 500 MG tablet Take by mouth 2 (two) times daily with a meal. Takes 1& 1/2 tables twice daily with meals  . Multiple Vitamin (MULTI-VITAMIN DAILY PO) Take by mouth.   No current facility-administered medications for this visit. (Other)      REVIEW OF SYSTEMS:    ALLERGIES No Known Allergies  PAST MEDICAL HISTORY Past Medical History:  Diagnosis Date  . Depression   .  Diabetes mellitus without complication (HCC)   . Hyperlipidemia   . Hypertension   . Macular degeneration   . PCOS (polycystic ovarian syndrome)   . Proteinuria    Past Surgical History:  Procedure Laterality Date  . PARTIAL HYSTERECTOMY      FAMILY HISTORY Family History  Problem Relation Age of Onset  . Hyperlipidemia Father   . Hypertension Father     SOCIAL HISTORY Social History   Tobacco Use  . Smoking status: Never Smoker  . Smokeless tobacco: Never Used         OPHTHALMIC EXAM: Base Eye Exam    Visual Acuity (ETDRS)      Right Left   Dist cc 20/200 -1 20/40 +2   Dist ph cc NI 20/20   Correction: Contacts       Tonometry (Tonopen, 2:07 PM)      Right Left   Pressure 16 16       Pupils      Pupils Dark Light Shape React APD   Right PERRL 4 3 Round Brisk None   Left PERRL 4 3 Round Brisk None       Visual Fields (Counting fingers)      Left Right    Full Full       Extraocular Movement  Right Left    Full Full       Neuro/Psych    Oriented x3: Yes   Mood/Affect: Normal       Dilation    Left eye: 1.0% Mydriacyl, 2.5% Phenylephrine @ 2:13 PM        Slit Lamp and Fundus Exam    External Exam      Right Left   External Normal Normal       Slit Lamp Exam      Right Left   Lids/Lashes Normal Normal   Conjunctiva/Sclera White and quiet White and quiet   Cornea Clear Clear   Anterior Chamber Deep and quiet Deep and quiet   Iris Round and reactive Round and reactive   Lens 2+ Nuclear sclerosis 2+ Nuclear sclerosis   Anterior Vitreous Normal Normal       Fundus Exam      Right Left   Posterior Vitreous  Posterior vitreous detachment, Central vitreous floaters   Disc  Peripapillary atrophy   C/D Ratio  0.35   Macula  Geographic atrophy splits the FAZ, no hemorrhage, no macular thickening   Vessels  Normal   Periphery  Normal,,          IMAGING AND PROCEDURES  Imaging and Procedures for 02/26/21  OCT, Retina - OU -  Both Eyes       Right Eye Quality was borderline. Scan locations included subfoveal. Central Foveal Thickness: 248. Progression has been stable. Findings include abnormal foveal contour, subretinal scarring, disciform scar.   Left Eye Quality was good. Scan locations included subfoveal. Central Foveal Thickness: 391. Progression has improved. Findings include abnormal foveal contour, subretinal scarring.   Notes OS, juxta foveal  CNVM Scarring from associated  myopic disease and or histo no signs of recurrence OS, 10-week follow-up  OD chronic disciform scar atrophy submacular no change        Intravitreal Injection, Pharmacologic Agent - OS - Left Eye       Time Out 02/26/2021. 3:18 PM. Confirmed correct patient, procedure, site, and patient consented.   Anesthesia Topical anesthesia was used. Anesthetic medications included Akten 3.5%.   Procedure Preparation included Tobramycin 0.3%, 5% betadine to ocular surface, 10% betadine to eyelids. A 30 gauge needle was used.   Injection:  2 mg aflibercept Gretta Cool) SOLN   NDC: L6038910, Lot: [id number]   Route: Intravitreal, Site: Left Eye, Waste: 0 mg  Post-op Post injection exam found visual acuity of at least counting fingers. The patient tolerated the procedure well. There were no complications. The patient received written and verbal post procedure care education.                 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 Exudative age-related macular degeneration of left eye with active choroidal neovascularization (HCC)  H35.3221 OCT, Retina - OU - Both Eyes    Intravitreal Injection, Pharmacologic Agent - OS - Left  Eye    aflibercept (EYLEA) SOLN 2 mg  2. 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  Wet ARMD OS, vastly improved and stable, history of multiple recurrences likely on the basis of previous presumed ocular  histoplasmosis syndrome.  Has remained stable at 10-week follow-up.  Repeat injection today Eylea and evaluate in 10 weeks  2.  Dilate OU next  3.  Ophthalmic Meds Ordered this visit:  Meds ordered this encounter  Medications  . aflibercept (EYLEA) SOLN 2 mg       Return in about 10 weeks (around 05/07/2021) for DILATE OU, EYLEA OCT, OS.  There are no Patient Instructions on file for this visit.   Explained the diagnoses, plan, and follow up with the patient and they expressed understanding.  Patient expressed understanding of the importance of proper follow up care.   Alford Highland Konni Kesinger M.D. Diseases & Surgery of the Retina and Vitreous [facility] 02/26/21     Abbreviations: M myopia (nearsighted); A astigmatism; H hyperopia (farsighted); P presbyopia; Mrx spectacle prescription;  CTL contact lenses; OD right eye; OS left eye; OU both eyes  XT exotropia; ET esotropia; PEK punctate epithelial keratitis; PEE punctate epithelial erosions; DES dry eye syndrome; MGD meibomian gland dysfunction; ATs artificial tears; PFAT's preservative free artificial tears; NSC nuclear sclerotic cataract; PSC posterior subcapsular cataract; ERM epi-retinal membrane; PVD posterior vitreous detachment; RD retinal detachment; DM diabetes mellitus; DR diabetic retinopathy; NPDR non-proliferative diabetic retinopathy; PDR proliferative diabetic retinopathy; CSME clinically significant macular edema; DME diabetic macular edema; dbh dot blot hemorrhages; CWS cotton wool spot; POAG primary open angle glaucoma; C/D cup-to-disc ratio; HVF humphrey visual field; GVF goldmann visual field; OCT optical coherence tomography; IOP intraocular pressure; BRVO Branch  retinal vein occlusion; CRVO central retinal vein occlusion; CRAO central retinal artery occlusion; BRAO branch retinal artery occlusion; RT retinal tear; SB scleral buckle; PPV pars plana vitrectomy; VH Vitreous hemorrhage; PRP panretinal laser photocoagulation; IVK intravitreal kenalog; VMT vitreomacular traction; MH Macular hole;  NVD neovascularization of the disc; NVE neovascularization elsewhere; AREDS age related eye disease study; ARMD age related macular degeneration; POAG primary open angle glaucoma; EBMD epithelial/anterior basement membrane dystrophy; ACIOL anterior chamber intraocular lens; IOL intraocular lens; PCIOL posterior chamber intraocular lens; Phaco/IOL phacoemulsification with intraocular lens placement; PRK photorefractive keratectomy; LASIK laser assisted in situ keratomileusis; HTN hypertension; DM diabetes mellitus; COPD chronic obstructive pulmonary disease

## 2021-02-26 NOTE — Assessment & Plan Note (Signed)
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

## 2021-02-26 NOTE — Assessment & Plan Note (Signed)
OD with subfoveal chorioretinal scar stable no active disease

## 2021-04-02 ENCOUNTER — Other Ambulatory Visit: Payer: Self-pay | Admitting: Family Medicine

## 2021-04-02 DIAGNOSIS — Z1231 Encounter for screening mammogram for malignant neoplasm of breast: Secondary | ICD-10-CM

## 2021-05-07 ENCOUNTER — Encounter (INDEPENDENT_AMBULATORY_CARE_PROVIDER_SITE_OTHER): Payer: BC Managed Care – PPO | Admitting: Ophthalmology

## 2021-05-07 ENCOUNTER — Encounter (INDEPENDENT_AMBULATORY_CARE_PROVIDER_SITE_OTHER): Payer: Self-pay | Admitting: Ophthalmology

## 2021-05-07 ENCOUNTER — Ambulatory Visit (INDEPENDENT_AMBULATORY_CARE_PROVIDER_SITE_OTHER): Payer: BC Managed Care – PPO | Admitting: Ophthalmology

## 2021-05-07 ENCOUNTER — Other Ambulatory Visit: Payer: Self-pay

## 2021-05-07 DIAGNOSIS — H32 Chorioretinal disorders in diseases classified elsewhere: Secondary | ICD-10-CM

## 2021-05-07 DIAGNOSIS — H353212 Exudative age-related macular degeneration, right eye, with inactive choroidal neovascularization: Secondary | ICD-10-CM | POA: Diagnosis not present

## 2021-05-07 DIAGNOSIS — B399 Histoplasmosis, unspecified: Secondary | ICD-10-CM | POA: Diagnosis not present

## 2021-05-07 DIAGNOSIS — H353221 Exudative age-related macular degeneration, left eye, with active choroidal neovascularization: Secondary | ICD-10-CM | POA: Diagnosis not present

## 2021-05-07 MED ORDER — AFLIBERCEPT 2MG/0.05ML IZ SOLN FOR KALEIDOSCOPE
2.0000 mg | INTRAVITREAL | Status: AC | PRN
  Administered 2021-05-07: 2 mg via INTRAVITREAL

## 2021-05-07 NOTE — Assessment & Plan Note (Signed)
No signs of active CNVM 

## 2021-05-07 NOTE — Assessment & Plan Note (Signed)
No active inflammation 

## 2021-05-07 NOTE — Assessment & Plan Note (Signed)
History of multiple recurrences of CNVM secondary to prior scar from POHS, 10-week follow-up interval.  Juxta foveal CNVM in the past.  High risk.  Repeat injection today and maintain 10-week interval

## 2021-05-07 NOTE — Progress Notes (Signed)
05/07/2021     CHIEF COMPLAINT Patient presents for Retina Follow Up (10 week fu OU and OCT/Eylea OS/Pt states [place] OU stable since last visit. Pt denies FOL, floaters, or ocular pain OU. /Stable old floaters/A1C: 7.1/LBS: 125)   HISTORY OF PRESENT ILLNESS: Cristina Moore is a 49 y.o. female who presents to the clinic today for:   HPI    Retina Follow Up    Diagnosis: Wet AMD   Laterality: left eye   Onset: 10 weeks ago   Severity: mild   Duration: 10 weeks   Course: stable   Comments: 10 week fu OU and OCT/Eylea OS Pt states [place] OU stable since last visit. Pt denies FOL, floaters, or ocular pain OU.  Stable old floaters A1C: 7.1 LBS: 125       Last edited by Demetrios Loll, COA on 05/07/2021  2:11 PM. (History)      Referring physician: Maurice Small, MD [address] [place],  [place] [address]  HISTORICAL INFORMATION:   Selected notes from the MEDICAL RECORD NUMBER       CURRENT MEDICATIONS: No current outpatient medications on file. (Ophthalmic Drugs)   No current facility-administered medications for this visit. (Ophthalmic Drugs)   Current Outpatient Medications (Other)  Medication Sig  . aspirin 81 MG tablet Take 81 mg by mouth daily.  Marland Kitchen atorvastatin (LIPITOR) 40 MG tablet Take 40 mg by mouth daily.  Marland Kitchen buPROPion (ZYBAN) 150 MG 12 hr tablet Take 150 mg by mouth 2 (two) times daily.  Marland Kitchen glimepiride (AMARYL) 2 MG tablet Take 2 mg by mouth daily with breakfast.  . lisinopril (PRINIVIL,ZESTRIL) 20 MG tablet Take 20 mg by mouth daily.  . metFORMIN (GLUCOPHAGE) 500 MG tablet Take by mouth 2 (two) times daily with a meal. Takes 1& 1/2 tables twice daily with meals  . Multiple Vitamin (MULTI-VITAMIN DAILY PO) Take by mouth.   No current facility-administered medications for this visit. (Other)      REVIEW OF SYSTEMS:    ALLERGIES No Known Allergies  PAST MEDICAL HISTORY Past Medical History:  Diagnosis Date  . Depression   . Diabetes  mellitus without complication (HCC)   . Hyperlipidemia   . Hypertension   . Macular degeneration   . PCOS (polycystic ovarian syndrome)   . Proteinuria    Past Surgical History:  Procedure Laterality Date  . PARTIAL HYSTERECTOMY      FAMILY HISTORY Family History  Problem Relation Age of Onset  . Hyperlipidemia Father   . Hypertension Father     SOCIAL HISTORY Social History   Tobacco Use  . Smoking status: Never Smoker  . Smokeless tobacco: Never Used         OPHTHALMIC EXAM:  Base Eye Exam    Visual Acuity (ETDRS)      Right Left   Dist cc 20/400 20/70 -1   Dist ph cc NI 20/30   Correction: Contacts       Tonometry (Tonopen, 2:16 PM)      Right Left   Pressure 16 18       Pupils      Pupils Dark Light Shape React APD   Right PERRL 4 3 Round Brisk None   Left PERRL 4 3 Round Brisk None       Visual Fields (Counting fingers)      Left Right    Full Full       Extraocular Movement  Right Left    Full Full       Neuro/Psych    Oriented x3: Yes   Mood/Affect: Normal       Dilation    Both eyes: 1.0% Mydriacyl, 2.5% Phenylephrine @ 2:16 PM        Slit Lamp and Fundus Exam    External Exam      Right Left   External Normal Normal       Slit Lamp Exam      Right Left   Lids/Lashes Normal Normal   Conjunctiva/Sclera White and quiet White and quiet   Cornea Clear Clear   Anterior Chamber Deep and quiet Deep and quiet   Iris Round and reactive Round and reactive   Lens 2+ Nuclear sclerosis 2+ Nuclear sclerosis   Anterior Vitreous Normal Normal       Fundus Exam      Right Left   Posterior Vitreous Posterior vitreous detachment, Central vitreous floaters Posterior vitreous detachment, Central vitreous floaters   Disc Normal Peripapillary atrophy   C/D Ratio 0.4 0.35   Macula Disciform scar Geographic atrophy splits the FAZ, no hemorrhage, no macular thickening   Vessels Normal Normal   Periphery Normal Normal,,           IMAGING AND PROCEDURES  Imaging and Procedures for 05/07/21  OCT, Retina - OU - Both Eyes       Right Eye Quality was borderline. Scan locations included subfoveal. Central Foveal Thickness: 253. Progression has been stable. Findings include abnormal foveal contour, subretinal scarring, disciform scar.   Left Eye Quality was good. Scan locations included subfoveal. Central Foveal Thickness: 304. Progression has improved. Findings include abnormal foveal contour, subretinal scarring.   Notes OS, juxta foveal  CNVM Scarring from associated  myopic disease and or histo no signs of recurrence OS, 10-week follow-up  OD chronic disciform scar atrophy submacular no change        Intravitreal Injection, Pharmacologic Agent - OS - Left Eye       Time Out 05/07/2021. 3:17 PM. Confirmed correct patient, procedure, site, and patient consented.   Anesthesia Topical anesthesia was used. Anesthetic medications included Akten 3.5%.   Procedure Preparation included Tobramycin 0.3%, 5% betadine to ocular surface, 10% betadine to eyelids. A 30 gauge needle was used.   Injection:  2 mg aflibercept Gretta Cool) SOLN   NDC: L6038910, Lot: [id number]   Route: Intravitreal, Site: Left Eye, Waste: 0 mg  Post-op Post injection exam found visual acuity of at least counting fingers. The patient tolerated the procedure well. There were no complications. The patient received written and verbal post procedure care education.                 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  Juxta foveal CNVM in the past.  High risk.  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 Exudative age-related macular degeneration of left eye with active choroidal neovascularization (HCC)  H35.3221 OCT, Retina - OU - Both Eyes    Intravitreal Injection, Pharmacologic Agent - OS - Left Eye    aflibercept (EYLEA) SOLN 2 mg  2. Exudative  age-related macular degeneration of right eye with inactive choroidal neovascularization (HCC)  H35.3212   3. 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  OS, stable macular condition at 10-week interval.  Repeat injection intravitreal Eylea OS today   Patient has a conflict at next interval of 10-week.  Return in 9 weeks for examination left eye and likely injection Eylea  2.  OD no signs of active disease  3.  Ophthalmic Meds Ordered this visit:  Meds ordered this encounter  Medications  . aflibercept (EYLEA) SOLN 2 mg       Return in about 9 weeks (around 07/09/2021) for dilate, OS, EYLEA OCT.  There are no Patient Instructions on file for this visit.   Explained the diagnoses, plan, and follow up with the patient and they expressed understanding.  Patient expressed understanding of the importance of proper follow up care.   Alford Highland Sonda Coppens M.D. Diseases & Surgery of the Retina and Vitreous [facility] 05/07/21     Abbreviations: M myopia (nearsighted); A astigmatism; H hyperopia (farsighted); P presbyopia; Mrx spectacle prescription;  CTL contact lenses; OD right eye; OS left eye; OU both eyes  XT exotropia; ET esotropia; PEK punctate epithelial keratitis; PEE punctate epithelial erosions; DES dry eye syndrome; MGD meibomian gland dysfunction; ATs artificial tears; PFAT's preservative free artificial tears; NSC nuclear sclerotic cataract; PSC posterior subcapsular cataract; ERM epi-retinal membrane; PVD posterior vitreous detachment; RD retinal detachment; DM diabetes  mellitus; DR diabetic retinopathy; NPDR non-proliferative diabetic retinopathy; PDR proliferative diabetic retinopathy; CSME clinically significant macular edema; DME diabetic macular edema; dbh dot blot hemorrhages; CWS cotton wool spot; POAG primary open angle glaucoma; C/D cup-to-disc ratio; HVF humphrey visual field; GVF goldmann visual field; OCT optical coherence tomography; IOP intraocular pressure; BRVO Branch retinal vein occlusion; CRVO central retinal vein occlusion; CRAO central retinal artery occlusion; BRAO branch retinal artery occlusion; RT retinal tear; SB scleral buckle; PPV pars plana vitrectomy; VH Vitreous hemorrhage; PRP panretinal laser photocoagulation; IVK intravitreal kenalog; VMT vitreomacular traction; MH Macular hole;  NVD neovascularization of the disc; NVE neovascularization elsewhere; AREDS age related eye disease study; ARMD age related macular degeneration; POAG primary open angle glaucoma; EBMD epithelial/anterior basement membrane dystrophy; ACIOL anterior chamber intraocular lens; IOL intraocular lens; PCIOL posterior chamber intraocular lens; Phaco/IOL phacoemulsification with intraocular lens placement; PRK photorefractive keratectomy; LASIK laser assisted in situ keratomileusis; HTN hypertension; DM diabetes mellitus; COPD chronic obstructive pulmonary disease

## 2021-05-30 ENCOUNTER — Ambulatory Visit
Admission: RE | Admit: 2021-05-30 | Discharge: 2021-05-30 | Disposition: A | Discharge status: Home / Self Care | Payer: BC Managed Care – PPO | Source: Ambulatory Visit | Attending: Family Medicine | Admitting: Family Medicine

## 2021-05-30 ENCOUNTER — Other Ambulatory Visit: Payer: Self-pay

## 2021-05-30 DIAGNOSIS — Z1231 Encounter for screening mammogram for malignant neoplasm of breast: Secondary | ICD-10-CM

## 2021-07-09 ENCOUNTER — Other Ambulatory Visit: Payer: Self-pay

## 2021-07-09 ENCOUNTER — Encounter (INDEPENDENT_AMBULATORY_CARE_PROVIDER_SITE_OTHER): Payer: BC Managed Care – PPO | Admitting: Ophthalmology

## 2021-07-09 ENCOUNTER — Ambulatory Visit (INDEPENDENT_AMBULATORY_CARE_PROVIDER_SITE_OTHER): Payer: BC Managed Care – PPO | Admitting: Ophthalmology

## 2021-07-09 DIAGNOSIS — B399 Histoplasmosis, unspecified: Secondary | ICD-10-CM | POA: Diagnosis not present

## 2021-07-09 DIAGNOSIS — H353221 Exudative age-related macular degeneration, left eye, with active choroidal neovascularization: Secondary | ICD-10-CM | POA: Diagnosis not present

## 2021-07-09 DIAGNOSIS — H353212 Exudative age-related macular degeneration, right eye, with inactive choroidal neovascularization: Secondary | ICD-10-CM | POA: Diagnosis not present

## 2021-07-09 DIAGNOSIS — H3530 Unspecified macular degeneration: Secondary | ICD-10-CM

## 2021-07-09 DIAGNOSIS — H32 Chorioretinal disorders in diseases classified elsewhere: Secondary | ICD-10-CM

## 2021-07-09 MED ORDER — AFLIBERCEPT 2MG/0.05ML IZ SOLN FOR KALEIDOSCOPE
2.0000 mg | INTRAVITREAL | Status: AC | PRN
  Administered 2021-07-09: 2 mg via INTRAVITREAL

## 2021-07-09 NOTE — Progress Notes (Signed)
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         Comments   In past months 10-week follow-up has been maintained, 9 weeks today due to a conflict with scheduling next week, will repeat injection today likely and reexamine again next in 10 weeks      Last edited by Edmon Crape, MD on 07/09/2021  2:47 PM.      Referring physician: Maurice Small, MD [address] [place],  [place] [address]  HISTORICAL INFORMATION:   Selected notes from the MEDICAL RECORD NUMBER       CURRENT MEDICATIONS: No current outpatient medications on file. (Ophthalmic Drugs)   No current facility-administered medications for this visit. (Ophthalmic Drugs)   Current Outpatient Medications (Other)  Medication Sig   aspirin 81 MG tablet Take 81 mg by mouth daily.   atorvastatin (LIPITOR) 40 MG tablet Take 40 mg by mouth daily.   buPROPion (ZYBAN) 150 MG 12 hr tablet Take 150 mg by mouth 2 (two) times daily.   glimepiride (AMARYL) 2 MG tablet Take 2 mg by mouth daily with breakfast.   lisinopril (PRINIVIL,ZESTRIL) 20 MG tablet Take 20 mg by mouth daily.   metFORMIN (GLUCOPHAGE) 500 MG tablet Take by mouth 2 (two) times daily  with a meal. Takes 1& 1/2 tables twice daily with meals   Multiple Vitamin (MULTI-VITAMIN DAILY PO) Take by mouth.   No current facility-administered medications for this visit. (Other)      REVIEW OF SYSTEMS:    ALLERGIES No Known Allergies  PAST MEDICAL HISTORY Past Medical History:  Diagnosis Date   Depression    Diabetes mellitus without complication (HCC)    Hyperlipidemia    Hypertension    Macular degeneration    PCOS (polycystic ovarian syndrome)    Proteinuria    Past Surgical History:  Procedure Laterality Date   BREAST BIOPSY Left 03/2017   PARTIAL HYSTERECTOMY      FAMILY HISTORY Family History  Problem Relation Age of Onset   Hyperlipidemia Father    Hypertension Father    Breast cancer Sister 15    SOCIAL HISTORY Social History   Tobacco Use   Smoking status: Never   Smokeless tobacco: Never         OPHTHALMIC EXAM:  Base Eye Exam     Visual Acuity (ETDRS)       Right Left   Dist cc 20/400 +1 20/30   Dist ph cc NI NI    Correction: Contacts         Tonometry (Tonopen, 2:05 PM)       Right Left   Pressure 12 13  Pupils       Pupils APD   Right PERRL None   Left PERRL None         Visual Fields       Left Right    Full    Restrictions  Central scotoma         Extraocular Movement       Right Left    Full, Ortho Full, Ortho         Neuro/Psych     Oriented x3: Yes   Mood/Affect: Normal         Dilation     Both eyes: 1.0% Mydriacyl, 2.5% Phenylephrine @ 2:03 PM           Slit Lamp and Fundus Exam     External Exam       Right Left   External Normal Normal         Slit Lamp Exam       Right Left   Lids/Lashes Normal Normal   Conjunctiva/Sclera White and quiet White and quiet   Cornea Clear Clear   Anterior Chamber Deep and quiet Deep and quiet   Iris Round and reactive Round and reactive   Lens 2+ Nuclear sclerosis 2+ Nuclear sclerosis   Anterior Vitreous Normal  Normal         Fundus Exam       Right Left   Posterior Vitreous Posterior vitreous detachment, Central vitreous floaters Posterior vitreous detachment, Central vitreous floaters   Disc Normal Peripapillary atrophy   C/D Ratio 0.4 0.35   Macula Disciform scar Geographic atrophy splits the FAZ, no hemorrhage, no macular thickening   Vessels Normal Normal   Periphery Normal Normal,,            IMAGING AND PROCEDURES  Imaging and Procedures for 07/09/21  OCT, Retina - OU - Both Eyes       Right Eye Quality was borderline. Scan locations included subfoveal. Central Foveal Thickness: 253. Progression has been stable. Findings include abnormal foveal contour, subretinal scarring, disciform scar.   Left Eye Quality was borderline. Scan locations included subfoveal. Central Foveal Thickness: 304. Progression has improved. Findings include abnormal foveal contour, subretinal scarring.   Notes OS, juxta foveal  CNVM Scarring from associated  myopic disease and or histo no signs of recurrence OS, 9-week follow-up today, with computer imaging system mapping of 344 inaccurate due to wandering baseline the nasal aspect of the FAZ.  No intraretinal fluid and subretinal fluid noted in the left eye today at 9-week follow-up repeat injection today as scheduled  OD chronic disciform scar atrophy submacular no change      Intravitreal Injection, Pharmacologic Agent - OS - Left Eye       Time Out 07/09/2021. 2:48 PM. Confirmed correct patient, procedure, site, and patient consented.   Anesthesia Topical anesthesia was used. Anesthetic medications included Akten 3.5%.   Procedure Preparation included Tobramycin 0.3%, 5% betadine to ocular surface, 10% betadine to eyelids. A 30 gauge needle was used.   Injection: 2 mg aflibercept 2 MG/0.05ML   Route: Intravitreal, Site: Left Eye   NDC: L6038910, Lot: [id number], Waste: 0 mL   Post-op Post injection exam found visual acuity of at  least counting fingers. The patient tolerated the procedure well. There were no complications. The patient received written and verbal post procedure care education.              ASSESSMENT/PLAN:  Exudative age-related macular degeneration of left eye with  active choroidal neovascularization (HCC) Today OS at 9 weeks post most recent evaluation and subsequent injection Eylea for CNVM related to age-related ARMD as well as a history of p.o. at bedtime.  Multiple recurrences in the past.  Monocular vision in the left eye present.  Will attempt to be made to preserve visual functioning in his left eye  Most recent injection and evaluation intervals at 10 weeks.  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 Exudative age-related macular degeneration of right eye with inactive choroidal neovascularization (HCC)  H35.3212 OCT, Retina - OU - Both Eyes    3. 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 Myopic macular degeneration of both eyes  H35.30       1.  OS, looks great, no signs of recurrent CNVM at this point at 9-week follow-up today.  History of multiple recurrences in the juxta foveal nearly subfoveal location.  Will resume Eylea injection today and examination again at 10-week interval next  2.  3.  Ophthalmic Meds Ordered this visit:  Meds ordered this  encounter  Medications   aflibercept (EYLEA) SOLN 2 mg       Return in about 10 weeks (around 09/17/2021) for dilate, OS, EYLEA OCT.  There are no Patient Instructions on file for this visit.   Explained the diagnoses, plan, and follow up with the patient and they expressed understanding.  Patient expressed understanding of the importance of proper follow up care.   Alford Highland Aislee Landgren M.D. Diseases & Surgery of the Retina and Vitreous [facility] 07/09/21     Abbreviations: M myopia (nearsighted); A astigmatism; H hyperopia (farsighted); P presbyopia; Mrx spectacle prescription;  CTL contact lenses; OD right eye; OS left eye; OU both eyes  XT exotropia; ET esotropia; PEK punctate epithelial keratitis; PEE punctate epithelial erosions; DES dry eye syndrome; MGD meibomian gland dysfunction; ATs artificial tears; PFAT's preservative free artificial tears; NSC nuclear sclerotic cataract; PSC posterior subcapsular cataract; ERM epi-retinal membrane; PVD posterior vitreous detachment; RD retinal detachment; DM diabetes mellitus; DR diabetic retinopathy; NPDR non-proliferative diabetic retinopathy; PDR proliferative diabetic retinopathy; CSME clinically significant macular edema; DME diabetic macular edema; dbh dot blot hemorrhages; CWS cotton wool spot; POAG primary open angle glaucoma; C/D cup-to-disc ratio; HVF humphrey visual field; GVF goldmann visual field; OCT optical coherence tomography; IOP intraocular pressure; BRVO Branch retinal vein occlusion; CRVO central retinal vein occlusion; CRAO central retinal artery occlusion; BRAO branch retinal artery occlusion; RT retinal tear; SB scleral buckle; PPV pars plana vitrectomy; VH Vitreous hemorrhage; PRP panretinal laser photocoagulation; IVK intravitreal kenalog; VMT vitreomacular traction; MH Macular hole;  NVD neovascularization of the disc; NVE neovascularization elsewhere; AREDS age related eye disease study; ARMD age related  macular degeneration; POAG primary open angle glaucoma; EBMD epithelial/anterior basement membrane dystrophy; ACIOL anterior chamber intraocular lens; IOL intraocular lens; PCIOL posterior chamber intraocular lens;  Phaco/IOL phacoemulsification with intraocular lens placement; Spur photorefractive keratectomy; LASIK laser assisted in situ keratomileusis; HTN hypertension; DM diabetes mellitus; COPD chronic obstructive pulmonary disease

## 2021-07-09 NOTE — Assessment & Plan Note (Signed)
No active retinitis

## 2021-07-09 NOTE — Assessment & Plan Note (Signed)
OD chronic subfoveal scarring no change overall

## 2021-07-09 NOTE — Assessment & Plan Note (Signed)
May be some component of CNVM formation

## 2021-07-09 NOTE — Assessment & Plan Note (Addendum)
Today OS at 9 weeks post most recent evaluation and subsequent injection Eylea for CNVM related to age-related ARMD as well as a history of p.o. at bedtime.  Multiple recurrences in the past.  Monocular vision in the left eye present.  Will attempt to be made to preserve visual functioning in his left eye  Most recent injection and evaluation intervals at 10 weeks.  Will resume 10-week interval of examination

## 2021-09-19 ENCOUNTER — Other Ambulatory Visit: Payer: Self-pay

## 2021-09-19 ENCOUNTER — Encounter (INDEPENDENT_AMBULATORY_CARE_PROVIDER_SITE_OTHER): Payer: Self-pay | Admitting: Ophthalmology

## 2021-09-19 ENCOUNTER — Ambulatory Visit (INDEPENDENT_AMBULATORY_CARE_PROVIDER_SITE_OTHER): Payer: BC Managed Care – PPO | Admitting: Ophthalmology

## 2021-09-19 DIAGNOSIS — H353221 Exudative age-related macular degeneration, left eye, with active choroidal neovascularization: Secondary | ICD-10-CM

## 2021-09-19 DIAGNOSIS — H353114 Nonexudative age-related macular degeneration, right eye, advanced atrophic with subfoveal involvement: Secondary | ICD-10-CM

## 2021-09-19 MED ORDER — AFLIBERCEPT 2MG/0.05ML IZ SOLN FOR KALEIDOSCOPE
2.0000 mg | INTRAVITREAL | Status: AC | PRN
  Administered 2021-09-19: 2 mg via INTRAVITREAL

## 2021-09-19 NOTE — Progress Notes (Signed)
09/19/2021     CHIEF COMPLAINT Patient presents for  Chief Complaint  Patient presents with   Retina Follow Up      HISTORY OF PRESENT ILLNESS: Cristina Moore is a 50 y.o. female who presents to the clinic today for:   HPI     Retina Follow Up   Patient presents with  Wet AMD.  In left eye.  This started 10 weeks ago.  Severity is mild.  Duration of 10 weeks.  Since onset it is stable.        Comments   10 week fu os and oct and eylea os Pt states VA OU stable since last visit. Pt denies FOL, floaters, or ocular pain OU.  A1C: 7.9 LBS: 140       Last edited by Demetrios Loll, COA on 09/19/2021  2:06 PM.      Referring physician: Maurice Small, MD [address] [place],  [place] [address]  HISTORICAL INFORMATION:   Selected notes from the MEDICAL RECORD NUMBER       CURRENT MEDICATIONS: No current outpatient medications on file. (Ophthalmic Drugs)   No current facility-administered medications for this visit. (Ophthalmic Drugs)   Current Outpatient Medications (Other)  Medication Sig   aspirin 81 MG tablet Take 81 mg by mouth daily.   atorvastatin (LIPITOR) 40 MG tablet Take 40 mg by mouth daily.   buPROPion (ZYBAN) 150 MG 12 hr tablet Take 150 mg by mouth 2 (two) times daily.   glimepiride (AMARYL) 2 MG tablet Take 2 mg by mouth daily with breakfast.   lisinopril (PRINIVIL,ZESTRIL) 20 MG tablet Take 20 mg by mouth daily.   metFORMIN (GLUCOPHAGE) 500 MG tablet Take by mouth 2 (two) times daily with a meal. Takes 1& 1/2 tables twice daily with meals   Multiple Vitamin (MULTI-VITAMIN DAILY PO) Take by mouth.   No current facility-administered medications for this visit. (Other)      REVIEW OF SYSTEMS:    ALLERGIES No Known Allergies  PAST MEDICAL HISTORY Past Medical History:  Diagnosis Date   Depression    Diabetes mellitus without complication (HCC)    Hyperlipidemia    Hypertension    Macular degeneration    PCOS  (polycystic ovarian syndrome)    Proteinuria    Past Surgical History:  Procedure Laterality Date   BREAST BIOPSY Left 03/2017   PARTIAL HYSTERECTOMY      FAMILY HISTORY Family History  Problem Relation Age of Onset   Hyperlipidemia Father    Hypertension Father    Breast cancer Sister 88    SOCIAL HISTORY Social History   Tobacco Use   Smoking status: Never   Smokeless tobacco: Never         OPHTHALMIC EXAM:  Base Eye Exam     Visual Acuity (ETDRS)       Right Left   Dist cc 20/200 20/30   Dist ph cc NI NI    Correction: Contacts         Tonometry (Tonopen, 2:12 PM)       Right Left   Pressure 12 12         Pupils       Pupils Dark Light Shape React APD   Right PERRL 4 3 Round Brisk None   Left PERRL 4 3 Round Brisk None         Visual Fields       Left Right   Restrictions  Central  scotoma         Extraocular Movement       Right Left    Full Full         Neuro/Psych     Oriented x3: Yes   Mood/Affect: Normal         Dilation     Left eye: 1.0% Mydriacyl, 2.5% Phenylephrine @ 2:12 PM           Slit Lamp and Fundus Exam     External Exam       Right Left   External Normal Normal         Slit Lamp Exam       Right Left   Lids/Lashes Normal Normal   Conjunctiva/Sclera White and quiet White and quiet   Cornea Clear Clear   Anterior Chamber Deep and quiet Deep and quiet   Iris Round and reactive Round and reactive   Lens 2+ Nuclear sclerosis 2+ Nuclear sclerosis   Anterior Vitreous Normal Normal         Fundus Exam       Right Left   Posterior Vitreous  Posterior vitreous detachment, Central vitreous floaters   Disc  Peripapillary atrophy   C/D Ratio  0.35   Macula  Geographic atrophy splits the FAZ, no hemorrhage, no macular thickening   Vessels  Normal   Periphery  Normal,, no holes or tears            IMAGING AND PROCEDURES  Imaging and Procedures for 09/19/21  OCT, Retina - OU -  Both Eyes       Right Eye Quality was borderline. Scan locations included subfoveal. Central Foveal Thickness: 243. Progression has been stable. Findings include abnormal foveal contour, subretinal scarring, disciform scar.   Left Eye Quality was borderline. Scan locations included subfoveal. Central Foveal Thickness: 295. Progression has improved. Findings include abnormal foveal contour, subretinal scarring.   Notes OS, juxta foveal  CNVM Scarring from associated  myopic disease and or histo no signs of recurrence OS, 10-week follow-up today, less thickening centrally, no intraretinal fluid and subretinal fluid noted in the left eye today at 10-week follow-up repeat injection today as scheduled  OD chronic disciform scar atrophy submacular no change      Intravitreal Injection, Pharmacologic Agent - OS - Left Eye       Time Out 09/19/2021. 2:59 PM. Confirmed correct patient, procedure, site, and patient consented.   Anesthesia Topical anesthesia was used. Anesthetic medications included Akten 3.5%.   Procedure Preparation included Tobramycin 0.3%, 5% betadine to ocular surface, 10% betadine to eyelids. A 30 gauge needle was used.   Injection: 2 mg aflibercept 2 MG/0.05ML   Route: Intravitreal, Site: Left Eye   NDC: L6038910, Lot: [id number], Waste: 0 mL   Post-op Post injection exam found visual acuity of at least counting fingers. The patient tolerated the procedure well. There were no complications. The patient received written and verbal post procedure care education. Post injection medications included gentamicin.              ASSESSMENT/PLAN:  Exudative age-related macular degeneration of left eye with active choroidal neovascularization (HCC) Vastly improved macular anatomy left eye, with paracentral and parafoveal scarring yet not active today at 10-week follow-up on Eylea.  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  OS remains improved and stable on intravitreal Eylea today at 10-week follow-up interval.  No signs of recurrence.  OS with a history of multiple recurrences in this monocular patient.  We will maintain at least quarterly injections and evaluations hereafter  2.  With the end of the year approaching will repeat follow-up examination in 9 weeks and thereafter we will attempt to go to 10 to 12 weeks  3.  Ophthalmic Meds Ordered this visit:  Meds ordered this encounter  Medications   aflibercept (EYLEA) SOLN 2 mg       Return in about 9 weeks (around 11/21/2021) for dilate, OS, EYLEA OCT.  There are no Patient Instructions on file for this visit.   Explained the diagnoses, plan, and follow up with the patient and they expressed understanding.  Patient expressed understanding of the importance of proper follow up care.   Alford Highland Gearline Spilman M.D. Diseases & Surgery of the Retina and Vitreous [facility] 09/19/21     Abbreviations: M myopia (nearsighted); A astigmatism; H hyperopia (farsighted); P presbyopia; Mrx spectacle prescription;  CTL contact lenses; OD right eye; OS left eye; OU both eyes  XT exotropia; ET esotropia; PEK punctate epithelial keratitis; PEE punctate epithelial erosions; DES dry eye syndrome; MGD meibomian gland dysfunction; ATs artificial tears;  PFAT's preservative free artificial tears; NSC nuclear sclerotic cataract; PSC posterior subcapsular cataract; ERM epi-retinal membrane; PVD posterior vitreous detachment; RD retinal detachment; DM diabetes mellitus; DR diabetic retinopathy; NPDR non-proliferative diabetic retinopathy; PDR proliferative diabetic retinopathy; CSME clinically significant macular edema; DME diabetic macular edema; dbh dot blot hemorrhages; CWS cotton wool spot; POAG primary open angle glaucoma; C/D cup-to-disc ratio; HVF humphrey visual field; GVF goldmann visual field; OCT optical coherence tomography; IOP intraocular pressure; BRVO Branch retinal vein occlusion; CRVO central retinal vein occlusion; CRAO central retinal artery occlusion; BRAO branch retinal artery occlusion; RT retinal tear; SB scleral buckle; PPV pars plana vitrectomy; VH Vitreous hemorrhage; PRP panretinal laser photocoagulation; IVK intravitreal kenalog; VMT vitreomacular traction; MH Macular hole;  NVD neovascularization of the disc; NVE neovascularization elsewhere; AREDS age related eye disease study; ARMD age related macular degeneration; POAG primary open angle glaucoma; EBMD epithelial/anterior basement membrane dystrophy; ACIOL anterior chamber intraocular lens; IOL intraocular lens; PCIOL posterior chamber intraocular lens; Phaco/IOL phacoemulsification with intraocular lens placement; PRK photorefractive keratectomy; LASIK laser assisted in situ keratomileusis; HTN hypertension; DM diabetes mellitus; COPD chronic obstructive pulmonary disease

## 2021-09-19 NOTE — Assessment & Plan Note (Signed)
Previa disciform scar OD as well as geographic atrophy accounts for acuity

## 2021-09-19 NOTE — Assessment & Plan Note (Signed)
Vastly improved macular anatomy left eye, with paracentral and parafoveal scarring yet not active today at 10-week follow-up on Eylea.  History of multiple recurrences thus likely need to continue on chronic suppressive therapy on a near quarterly basis for long-term

## 2021-11-20 ENCOUNTER — Other Ambulatory Visit: Payer: Self-pay

## 2021-11-20 ENCOUNTER — Encounter (INDEPENDENT_AMBULATORY_CARE_PROVIDER_SITE_OTHER): Payer: Self-pay | Admitting: Ophthalmology

## 2021-11-20 ENCOUNTER — Ambulatory Visit (INDEPENDENT_AMBULATORY_CARE_PROVIDER_SITE_OTHER): Payer: BC Managed Care – PPO | Admitting: Ophthalmology

## 2021-11-20 DIAGNOSIS — H353221 Exudative age-related macular degeneration, left eye, with active choroidal neovascularization: Secondary | ICD-10-CM | POA: Diagnosis not present

## 2021-11-20 DIAGNOSIS — B399 Histoplasmosis, unspecified: Secondary | ICD-10-CM

## 2021-11-20 DIAGNOSIS — H32 Chorioretinal disorders in diseases classified elsewhere: Secondary | ICD-10-CM | POA: Diagnosis not present

## 2021-11-20 DIAGNOSIS — H353114 Nonexudative age-related macular degeneration, right eye, advanced atrophic with subfoveal involvement: Secondary | ICD-10-CM

## 2021-11-20 MED ORDER — AFLIBERCEPT 2MG/0.05ML IZ SOLN FOR KALEIDOSCOPE
2.0000 mg | INTRAVITREAL | Status: AC | PRN
  Administered 2021-11-20: 15:00:00 2 mg via INTRAVITREAL

## 2021-11-20 NOTE — Assessment & Plan Note (Signed)
Likely inciting event to CNVM OU and prior macular scarring OD

## 2021-11-20 NOTE — Progress Notes (Signed)
11/20/2021     CHIEF COMPLAINT Patient presents for  Chief Complaint  Patient presents with   Retina Follow Up      HISTORY OF PRESENT ILLNESS: Cristina Moore is a 50 y.o. female who presents to the clinic today for:   HPI     Retina Follow Up           Diagnosis: Wet AMD   Laterality: left eye   Onset: 9 weeks ago   Severity: mild   Duration: 9 weeks   Course: stable         Comments   9 week fu OS and OCT and Eylea OS  Pt states VA OU stable since last visit. Pt denies FOL, floaters, or ocular pain OU.  Pt states, "I cannot tell any real change, I am still just trying to still deal with these floaters."       Last edited by Kendra Opitz, COA on 11/20/2021  1:54 PM.      Referring physician: Kelton Pillar, MD [place] [place],  [place] [address]  HISTORICAL INFORMATION:   Selected notes from the [facility]: No current outpatient medications on file. (Ophthalmic Drugs)   No current facility-administered medications for this visit. (Ophthalmic Drugs)   Current Outpatient Medications (Other)  Medication Sig   aspirin 81 MG tablet Take 81 mg by mouth daily.   atorvastatin (LIPITOR) 40 MG tablet Take 40 mg by mouth daily.   buPROPion (ZYBAN) 150 MG 12 hr tablet Take 150 mg by mouth 2 (two) times daily.   glimepiride (AMARYL) 2 MG tablet Take 2 mg by mouth daily with breakfast.   lisinopril (PRINIVIL,ZESTRIL) 20 MG tablet Take 20 mg by mouth daily.   metFORMIN (GLUCOPHAGE) 500 MG tablet Take by mouth 2 (two) times daily with a meal. Takes 1& 1/2 tables twice daily with meals   Multiple Vitamin (MULTI-VITAMIN DAILY PO) Take by mouth.   No current facility-administered medications for this visit. (Other)      REVIEW OF SYSTEMS:    ALLERGIES No Known Allergies  PAST MEDICAL HISTORY Past Medical History:  Diagnosis Date   Depression    Diabetes mellitus without complication (HCC)     Hyperlipidemia    Hypertension    Macular degeneration    PCOS (polycystic ovarian syndrome)    Proteinuria    Past Surgical History:  Procedure Laterality Date   BREAST BIOPSY Left 03/2017   PARTIAL HYSTERECTOMY      FAMILY HISTORY Family History  Problem Relation Age of Onset   Hyperlipidemia Father    Hypertension Father    Breast cancer Sister 26    SOCIAL HISTORY Social History   Tobacco Use   Smoking status: Never   Smokeless tobacco: Never         OPHTHALMIC EXAM:  Base Eye Exam     Visual Acuity (ETDRS)       Right Left   Dist cc 20/200 ecc 20/80 -1   Dist ph cc NI 20/50 -2    Correction: Contacts         Tonometry (Tonopen, 2:00 PM)       Right Left   Pressure 11 13         Pupils       Pupils React APD   Right PERRL Brisk None   Left PERRL Brisk None  Visual Fields       Left Right    Full    Restrictions  Partial inner superior temporal, inferior temporal, superior nasal, inferior nasal deficiencies         Extraocular Movement       Right Left    Full Full         Neuro/Psych     Oriented x3: Yes   Mood/Affect: Normal         Dilation     Left eye: 1.0% Mydriacyl, 2.5% Phenylephrine @ 2:00 PM           Slit Lamp and Fundus Exam     External Exam       Right Left   External Normal Normal         Slit Lamp Exam       Right Left   Lids/Lashes Normal Normal   Conjunctiva/Sclera White and quiet White and quiet   Cornea Clear Clear   Anterior Chamber Deep and quiet Deep and quiet   Iris Round and reactive Round and reactive   Lens 2+ Nuclear sclerosis 2+ Nuclear sclerosis   Anterior Vitreous Normal Normal         Fundus Exam       Right Left   Posterior Vitreous  Posterior vitreous detachment, Central vitreous floaters   Disc  Peripapillary atrophy   C/D Ratio  0.35   Macula  Geographic atrophy splits the FAZ, no hemorrhage, no macular thickening   Vessels  Normal    Periphery  Normal,, no holes or tears            IMAGING AND PROCEDURES  Imaging and Procedures for 11/20/21  OCT, Retina - OU - Both Eyes       Right Eye Quality was borderline. Scan locations included subfoveal. Central Foveal Thickness: 312. Progression has been stable. Findings include abnormal foveal contour, subretinal scarring, disciform scar.   Left Eye Quality was borderline. Scan locations included subfoveal. Central Foveal Thickness: 299. Progression has improved. Findings include abnormal foveal contour, subretinal scarring.   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 2:46 PM. Confirmed correct patient, procedure, site, and patient consented.   Anesthesia Topical anesthesia was used. Anesthetic medications included Lidocaine 4%.   Procedure Preparation included 10% betadine to eyelids, 5% betadine to ocular surface. A 30 gauge needle was used.   Injection: 2 mg aflibercept 2 MG/0.05ML   Route: Intravitreal, Site: Left Eye   NDC: [id number], Lot: [id number], Waste: 0 mL   Post-op Post injection exam found visual acuity of at least counting fingers. The patient tolerated the procedure well. There were no complications. The patient received written and verbal post procedure care education. Post injection medications were not given.              ASSESSMENT/PLAN:  Advanced nonexudative age-related macular degeneration of right eye with subfoveal involvement Accounts for acuity OD  Exudative age-related macular degeneration of left eye with active choroidal  neovascularization (HCC) Chronic active in multiple recurrences.  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 Advanced nonexudative age-related macular degeneration of right eye with subfoveal involvement  H35.3114     3. 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  OS today stable CNVM at 9-week interval post intravitreal Eylea.  We will repeat injection today to maintain in an eye with multiple recurrences and with scarring juxta foveal  2.  3.  Ophthalmic Meds Ordered this visit:  Meds ordered this encounter  Medications   aflibercept (EYLEA) SOLN 2 mg       Return in about 9 weeks (around 01/22/2022) for dilate, OS, EYLEA OCT.  There are no Patient Instructions on file for this visit.   Explained the diagnoses, plan, and follow up with the patient and they expressed understanding.  Patient expressed understanding of the importance of proper follow up care.   Alford Highland Oliviah Agostini M.D. Diseases & Surgery of the Retina and Vitreous [facility] 11/20/21     Abbreviations: M myopia (nearsighted); A astigmatism; H hyperopia (farsighted); P presbyopia; Mrx spectacle prescription;  CTL contact lenses; OD right eye; OS left eye; OU both eyes  XT exotropia; ET esotropia; PEK punctate epithelial keratitis; PEE punctate epithelial erosions; DES dry eye syndrome; MGD meibomian gland dysfunction; ATs artificial tears; PFAT's preservative free artificial tears; NSC nuclear sclerotic cataract; PSC posterior subcapsular cataract; ERM  epi-retinal membrane; PVD posterior vitreous detachment; RD retinal detachment; DM diabetes mellitus; DR diabetic retinopathy; NPDR non-proliferative diabetic retinopathy; PDR proliferative diabetic retinopathy; CSME clinically significant macular edema; DME diabetic macular edema; dbh dot blot hemorrhages; CWS cotton wool spot; POAG primary open angle glaucoma; C/D cup-to-disc ratio; HVF humphrey visual field; GVF goldmann visual field; OCT optical coherence tomography; IOP intraocular pressure; BRVO Branch retinal vein occlusion; CRVO central retinal vein occlusion; CRAO central retinal artery occlusion; BRAO branch retinal artery occlusion; RT retinal tear; SB scleral buckle; PPV pars plana vitrectomy; VH Vitreous hemorrhage; PRP panretinal laser photocoagulation; IVK intravitreal kenalog; VMT vitreomacular traction; MH Macular hole;  NVD neovascularization of the disc; NVE neovascularization elsewhere; AREDS age related eye disease study; ARMD age related macular degeneration; POAG primary open angle glaucoma; EBMD epithelial/anterior basement membrane dystrophy; ACIOL anterior chamber intraocular lens; IOL intraocular lens; PCIOL posterior chamber intraocular lens; Phaco/IOL phacoemulsification with intraocular lens placement; PRK photorefractive keratectomy; LASIK laser assisted in situ keratomileusis; HTN hypertension; DM diabetes mellitus; COPD chronic obstructive pulmonary disease

## 2021-11-20 NOTE — Assessment & Plan Note (Signed)
Chronic active in multiple recurrences.  Stabilized and controlled at 9-week interval currently

## 2021-11-20 NOTE — Assessment & Plan Note (Signed)
Accounts for acuity OD 

## 2021-11-21 ENCOUNTER — Encounter (INDEPENDENT_AMBULATORY_CARE_PROVIDER_SITE_OTHER): Payer: BC Managed Care – PPO | Admitting: Ophthalmology

## 2022-01-18 IMAGING — MG MM DIGITAL SCREENING BILAT W/ TOMO AND CAD
8 series · 8 of 24 positions shown · non-contrast
Comparison: Previous exam(s).

CLINICAL DATA: Screening.

EXAM:
DIGITAL SCREENING BILATERAL MAMMOGRAM WITH TOMOSYNTHESIS AND CAD
TECHNIQUE: Bilateral screening digital craniocaudal and mediolateral oblique
mammograms were obtained. Bilateral screening digital breast
tomosynthesis was performed. The images were evaluated with
computer-aided detection.

[R MLO synth-2D]
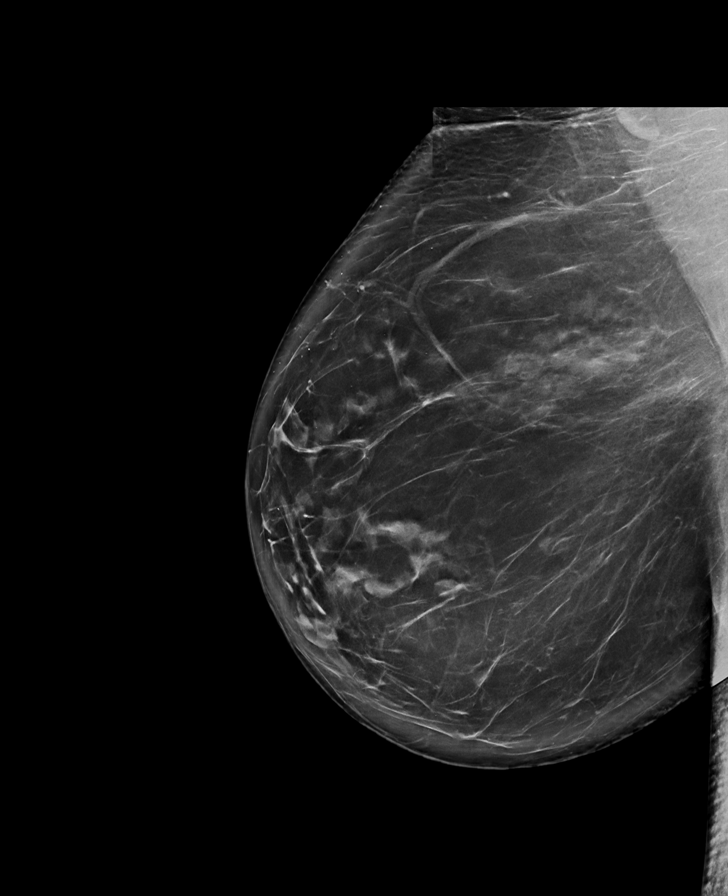

[R CC synth-2D]
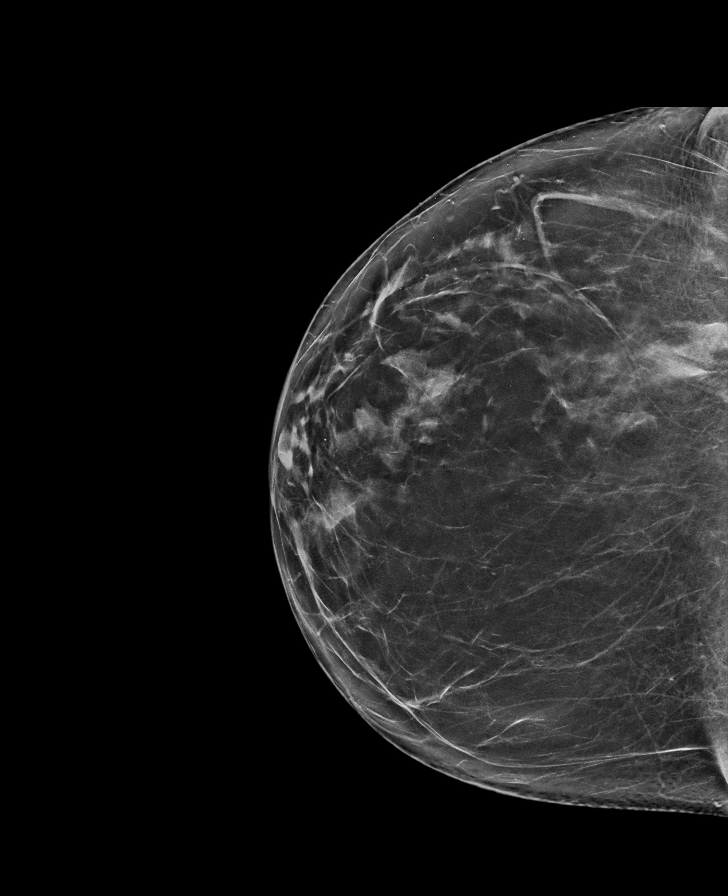

[L MLO synth-2D]
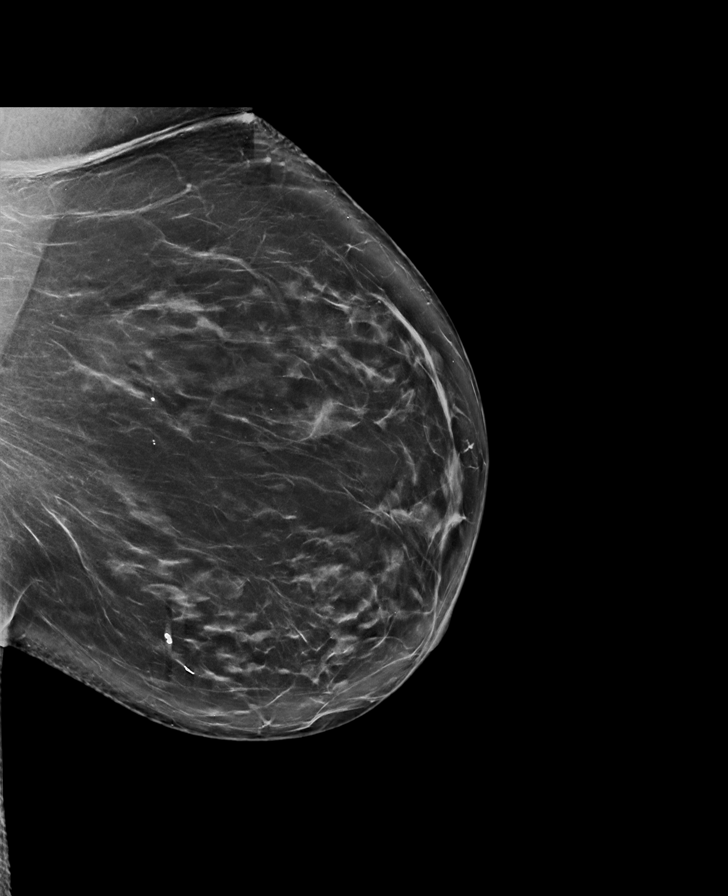

[L CC synth-2D]
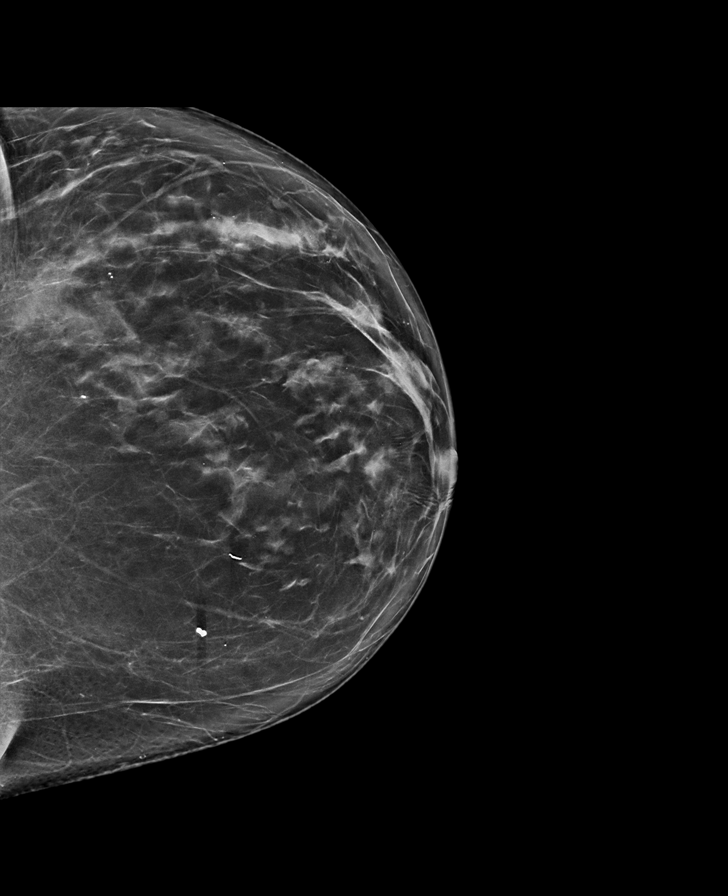

[R MLO tomo · tomo slice 49/98.0]
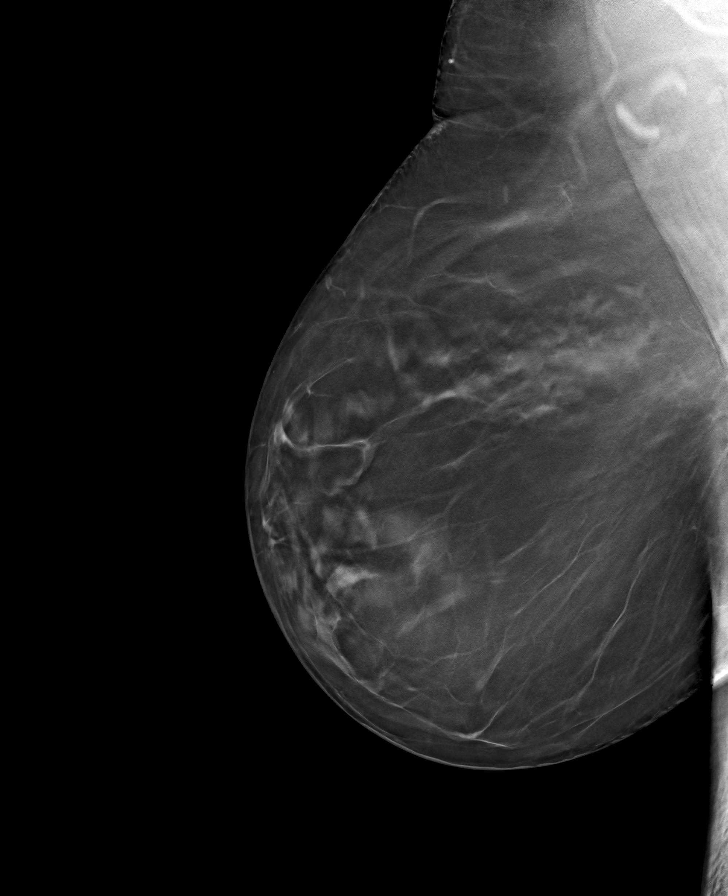

[L MLO tomo · tomo slice 49/97.0]
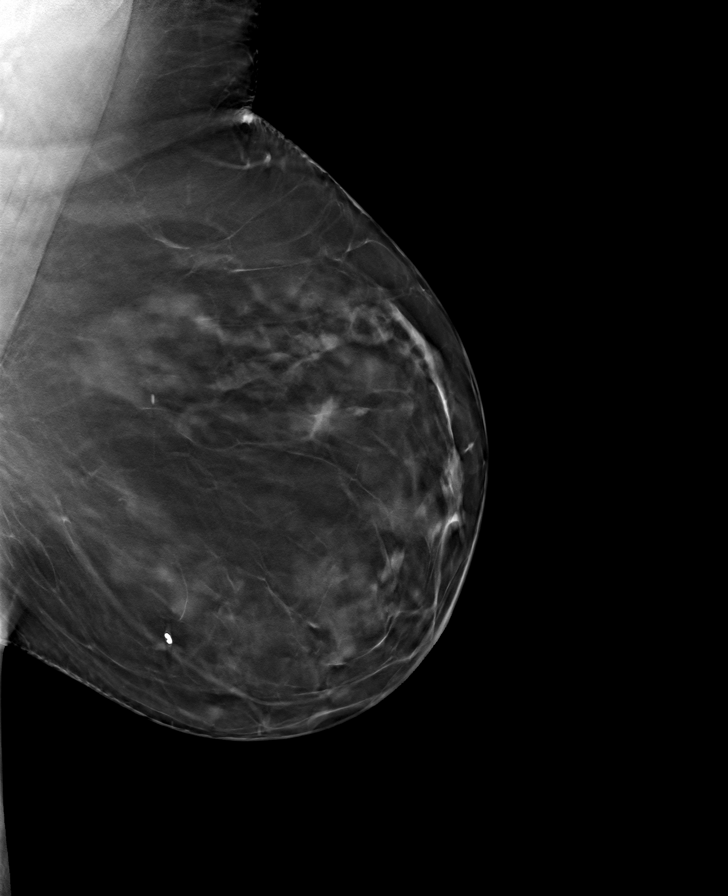

[R CC tomo · tomo slice 39/78.0]
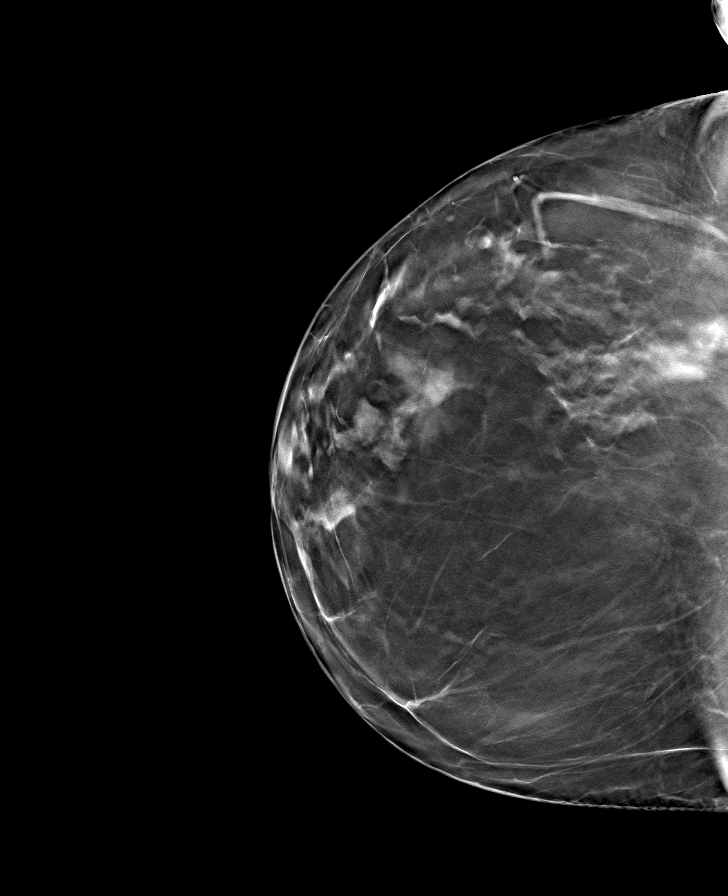

[L CC tomo · tomo slice 41/82.0]
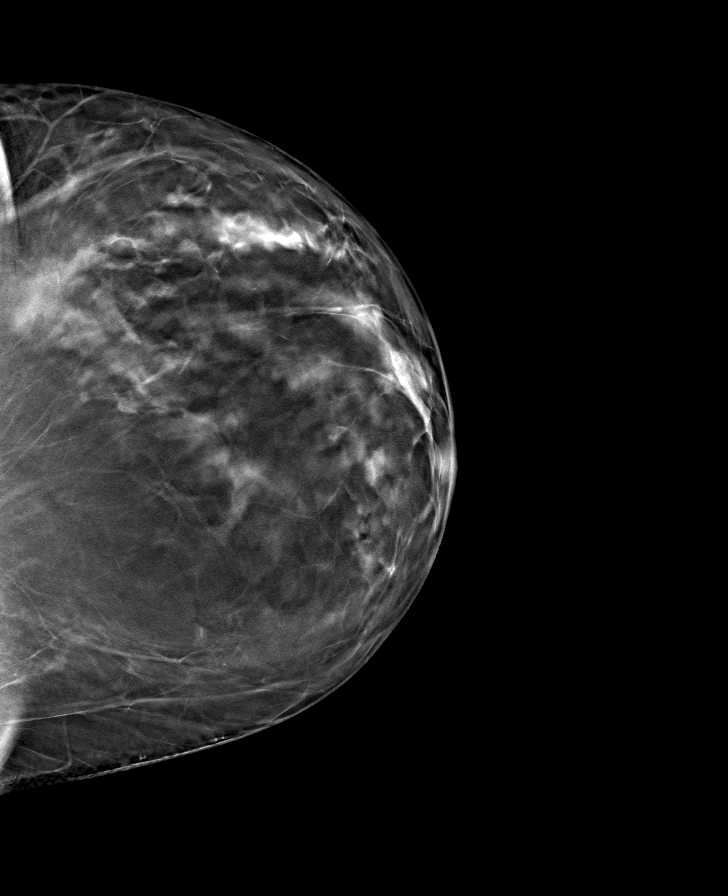

[8 of 24 positions shown; findings below may reference images not displayed]

ACR Breast Density Category b: There are scattered areas of
fibroglandular density.
FINDINGS: There are no findings suspicious for malignancy.
IMPRESSION: No mammographic evidence of malignancy. A result letter of this
screening mammogram will be mailed directly to the patient.

RECOMMENDATION:
Screening mammogram in one year. (Code:51-O-LD2)

BI-RADS CATEGORY  1: Negative.

## 2022-01-23 ENCOUNTER — Encounter (INDEPENDENT_AMBULATORY_CARE_PROVIDER_SITE_OTHER): Payer: Self-pay | Admitting: Ophthalmology

## 2022-01-23 ENCOUNTER — Ambulatory Visit (INDEPENDENT_AMBULATORY_CARE_PROVIDER_SITE_OTHER): Payer: BC Managed Care – PPO | Admitting: Ophthalmology

## 2022-01-23 ENCOUNTER — Other Ambulatory Visit: Payer: Self-pay

## 2022-01-23 DIAGNOSIS — H353212 Exudative age-related macular degeneration, right eye, with inactive choroidal neovascularization: Secondary | ICD-10-CM

## 2022-01-23 DIAGNOSIS — H2513 Age-related nuclear cataract, bilateral: Secondary | ICD-10-CM

## 2022-01-23 DIAGNOSIS — H353221 Exudative age-related macular degeneration, left eye, with active choroidal neovascularization: Secondary | ICD-10-CM

## 2022-01-23 MED ORDER — AFLIBERCEPT 2MG/0.05ML IZ SOLN FOR KALEIDOSCOPE
2.0000 mg | INTRAVITREAL | Status: AC | PRN
  Administered 2022-01-23: 2 mg via INTRAVITREAL

## 2022-01-23 NOTE — Assessment & Plan Note (Signed)
Chronic stable no active disease

## 2022-01-23 NOTE — Assessment & Plan Note (Signed)
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  CNVM not active now for some years, yet location and high risk for recurrence

## 2022-01-23 NOTE — Assessment & Plan Note (Signed)
The nature of cataract was discussed with the patient as well as the elective nature of surgery. The patient was reassured that surgery at a later date does not put the patient at risk for a worse outcome. It was emphasized that the need for surgery is dictated by the patient's quality of life as influenced by the cataract. Patient was instructed to maintain close follow up with their general eye care doctor.  Mild in terms of opacity but moderate in terms of yellowish color intent thus likely affecting her nighttime vision difficulty and also magnifying the extent with central vitreous floaters affect her quality of vision

## 2022-01-23 NOTE — Progress Notes (Signed)
01/23/2022     CHIEF COMPLAINT Patient presents for  Chief Complaint  Patient presents with   Macular Degeneration    History of wet AMD OS and OU related also to history of p.o. at bedtime.  No interval change in acuity no wavy vision  HISTORY OF PRESENT ILLNESS: Cristina Moore is a 51 y.o. female who presents to the clinic today for:   HPI   9 weeks dilate OS, Eylea OCT. Patient states vision is stable and unchanged since last visit. Denies any new floaters or FOL.  Last edited by Laurin Coder on 01/23/2022  2:19 PM.      Referring physician: Camillo Flaming, [address] [place],  [place] [address]  HISTORICAL INFORMATION:   Selected notes from the MEDICAL RECORD NUMBER       CURRENT MEDICATIONS: No current outpatient medications on file. (Ophthalmic Drugs)   No current facility-administered medications for this visit. (Ophthalmic Drugs)   Current Outpatient Medications (Other)  Medication Sig   aspirin 81 MG tablet Take 81 mg by mouth daily.   atorvastatin (LIPITOR) 40 MG tablet Take 40 mg by mouth daily.   buPROPion (ZYBAN) 150 MG 12 hr tablet Take 150 mg by mouth 2 (two) times daily.   glimepiride (AMARYL) 2 MG tablet Take 2 mg by mouth daily with breakfast.   lisinopril (PRINIVIL,ZESTRIL) 20 MG tablet Take 20 mg by mouth daily.   metFORMIN (GLUCOPHAGE) 500 MG tablet Take by mouth 2 (two) times daily with a meal. Takes 1& 1/2 tables twice daily with meals   Multiple Vitamin (MULTI-VITAMIN DAILY PO) Take by mouth.   No current facility-administered medications for this visit. (Other)      REVIEW OF SYSTEMS: ROS   Negative for: Constitutional, Gastrointestinal, Neurological, Skin, Genitourinary, Musculoskeletal, HENT, Endocrine, Cardiovascular, Eyes, Respiratory, Psychiatric, Allergic/Imm, Heme/Lymph Last edited by Hurman Horn, MD on 01/23/2022  2:54 PM.       ALLERGIES No Known Allergies  PAST MEDICAL HISTORY Past Medical  History:  Diagnosis Date   Depression    Diabetes mellitus without complication ([place])    Hyperlipidemia    Hypertension    Macular degeneration    PCOS (polycystic ovarian syndrome)    Proteinuria    Past Surgical History:  Procedure Laterality Date   BREAST BIOPSY Left 03/2017   PARTIAL HYSTERECTOMY      FAMILY HISTORY Family History  Problem Relation Age of Onset   Hyperlipidemia Father    Hypertension Father    Breast cancer Sister 72    SOCIAL HISTORY Social History   Tobacco Use   Smoking status: Never   Smokeless tobacco: Never         OPHTHALMIC EXAM:  Base Eye Exam     Visual Acuity (ETDRS)       Right Left   Dist cc 20/200 20/50   Dist ph cc 20/160 20/40    Correction: Contacts         Tonometry (Tonopen, 2:24 PM)       Right Left   Pressure 17 19         Pupils       APD   Right None   Left None         Extraocular Movement       Right Left    Full Full         Neuro/Psych     Oriented x3: Yes   Mood/Affect: Normal  Dilation     Left eye: 1.0% Mydriacyl, 2.5% Phenylephrine @ 2:24 PM           Slit Lamp and Fundus Exam     External Exam       Right Left   External Normal Normal         Slit Lamp Exam       Right Left   Lids/Lashes Normal Normal   Conjunctiva/Sclera White and quiet White and quiet   Cornea Clear Clear   Anterior Chamber Deep and quiet Deep and quiet   Iris Round and reactive Round and reactive   Lens 2+ Nuclear sclerosis 2+ Nuclear sclerosis, with moderate color change yellow-green which might explain her night vision difficultiesand central small PSC but not visually significant yet   Anterior Vitreous Normal Normal         Fundus Exam       Right Left   Posterior Vitreous  Posterior vitreous detachment, Central vitreous floaters   Disc  Peripapillary atrophy   C/D Ratio  0.35   Macula  Geographic atrophy splits the FAZ, no hemorrhage, no macular thickening    Vessels  Normal   Periphery  Normal,, no holes or tears            IMAGING AND PROCEDURES  Imaging and Procedures for 01/23/22  Intravitreal Injection, Pharmacologic Agent - OS - Left Eye       Time Out 01/23/2022. 2:55 PM. Confirmed correct patient, procedure, site, and patient consented.   Anesthesia Topical anesthesia was used. Anesthetic medications included Lidocaine 4%.   Procedure Preparation included 10% betadine to eyelids, 5% betadine to ocular surface. A 30 gauge needle was used.   Injection: 2 mg aflibercept 2 MG/0.05ML   Route: Intravitreal, Site: Left Eye   NDC: [id number], Lot: [id number], Waste: 0 mL   Post-op Post injection exam found visual acuity of at least counting fingers. The patient tolerated the procedure well. There were no complications. The patient received written and verbal post procedure care education. Post injection medications were not given.      OCT, Retina - OU - Both Eyes       Right Eye Quality was good. Scan locations included subfoveal. Findings include myopic contour, abnormal foveal contour, subretinal hyper-reflective material, disciform scar.   Left Eye Quality was borderline. Scan locations included subfoveal. Progression has been stable. Findings include abnormal foveal contour, myopic contour, disciform scar.   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  CNVM  not active now for some years, yet location and high risk for recurrence  Nuclear sclerotic cataract of both eyes The nature of cataract was discussed with the patient as well as the elective nature of surgery. The patient was reassured that surgery at a later date does not put the patient at risk for a worse outcome. It was emphasized that the need for surgery is dictated by the patient's quality of life as influenced by the cataract. Patient was instructed to maintain close follow  up with their general eye care doctor.  Mild in terms of opacity but moderate in terms of yellowish color intent thus likely affecting her nighttime vision difficulty and also magnifying the extent with central vitreous floaters affect her quality of vision     ICD-10-CM   1. Exudative age-related macular degeneration of left eye with active choroidal neovascularization (HCC)  H35.3221 Intravitreal Injection, Pharmacologic Agent - OS - Left Eye    OCT, Retina - OU - Both Eyes    aflibercept (EYLEA) SOLN 2 mg    2. Exudative age-related macular degeneration of right eye with inactive choroidal neovascularization ([place])  H35.3212     3. Nuclear sclerotic cataract of both eyes  H25.13       1.  OS we will repeat intravitreal Eylea today to maintain in this monocular patient with high risk for recurrences  2.  No signs of active disease OD  3.  Cataract progression explained to her nighttime visual difficulties although central visual floaters are made worse by the same cataract progression  4.  Regarding the latter condition, routine follow-up with Dr. Burt Knack will continue to monitor for cataract progression and its impact on the patient.  Certainly if nighttime vision is required for the patient's life activities of daily living, cataract surgery can be discussed via Dr. Peter Garter and his colleagues  Ophthalmic Meds Ordered this visit:  Meds ordered this encounter  Medications   aflibercept (EYLEA) SOLN 2 mg        Return in about 9 weeks (around 03/27/2022) for DILATE OU, EYLEA OCT, OS.  There are no Patient Instructions on file for this visit.   Explained the diagnoses, plan, and follow up with the patient and they expressed understanding.  Patient expressed understanding of the importance of proper follow up care.   Clent Demark Ioma Chismar M.D. Diseases & Surgery of the Retina and Vitreous [facility] 01/23/22     Abbreviations: M myopia (nearsighted); A astigmatism; H hyperopia (farsighted); P presbyopia; Mrx spectacle prescription;  CTL contact lenses; OD right eye; OS left eye; OU both eyes  XT exotropia; ET esotropia; PEK punctate epithelial keratitis; PEE punctate epithelial erosions; DES dry eye syndrome; MGD meibomian gland dysfunction; ATs artificial tears; PFAT's preservative free artificial tears; [place] nuclear sclerotic cataract; PSC posterior subcapsular cataract; ERM epi-retinal membrane; PVD posterior vitreous detachment; RD retinal detachment; DM diabetes mellitus; DR diabetic retinopathy; NPDR non-proliferative diabetic retinopathy; PDR proliferative diabetic retinopathy; CSME clinically significant macular edema; DME diabetic macular edema; dbh dot blot hemorrhages; CWS cotton wool spot; POAG primary open angle glaucoma; C/D cup-to-disc ratio; HVF humphrey visual field; GVF goldmann visual field; OCT optical coherence tomography; IOP intraocular pressure; BRVO Branch retinal vein occlusion; CRVO central retinal vein occlusion; CRAO central retinal artery occlusion; BRAO branch retinal artery occlusion; RT retinal tear; SB scleral buckle; PPV pars plana vitrectomy; VH Vitreous hemorrhage; PRP panretinal laser photocoagulation; IVK intravitreal kenalog; VMT vitreomacular traction; MH Macular hole;  NVD neovascularization of the disc; NVE neovascularization elsewhere; AREDS age related eye disease study; ARMD age related macular degeneration; POAG primary open angle glaucoma; EBMD  epithelial/anterior basement membrane dystrophy; ACIOL anterior chamber intraocular lens; IOL intraocular lens; PCIOL posterior chamber intraocular lens; Phaco/IOL phacoemulsification with intraocular lens placement; [place] photorefractive keratectomy; LASIK laser assisted in situ keratomileusis; HTN hypertension; DM diabetes mellitus; COPD chronic obstructive pulmonary disease

## 2022-03-27 ENCOUNTER — Encounter (INDEPENDENT_AMBULATORY_CARE_PROVIDER_SITE_OTHER): Payer: BC Managed Care – PPO | Admitting: Ophthalmology

## 2022-03-27 ENCOUNTER — Ambulatory Visit (INDEPENDENT_AMBULATORY_CARE_PROVIDER_SITE_OTHER): Payer: BC Managed Care – PPO | Admitting: Ophthalmology

## 2022-03-27 ENCOUNTER — Encounter (INDEPENDENT_AMBULATORY_CARE_PROVIDER_SITE_OTHER): Payer: Self-pay | Admitting: Ophthalmology

## 2022-03-27 DIAGNOSIS — H32 Chorioretinal disorders in diseases classified elsewhere: Secondary | ICD-10-CM

## 2022-03-27 DIAGNOSIS — H353114 Nonexudative age-related macular degeneration, right eye, advanced atrophic with subfoveal involvement: Secondary | ICD-10-CM | POA: Diagnosis not present

## 2022-03-27 DIAGNOSIS — H353221 Exudative age-related macular degeneration, left eye, with active choroidal neovascularization: Secondary | ICD-10-CM

## 2022-03-27 DIAGNOSIS — H2513 Age-related nuclear cataract, bilateral: Secondary | ICD-10-CM

## 2022-03-27 DIAGNOSIS — B399 Histoplasmosis, unspecified: Secondary | ICD-10-CM

## 2022-03-27 MED ORDER — AFLIBERCEPT 2MG/0.05ML IZ SOLN FOR KALEIDOSCOPE
2.0000 mg | INTRAVITREAL | Status: AC | PRN
  Administered 2022-03-27: 2 mg via INTRAVITREAL

## 2022-03-27 NOTE — Assessment & Plan Note (Signed)
Stabilized and inactive disease juxta foveal from CNVM likely related to previous OHS syndrome. We will continue to monitor and treat  ?Today at 9-week interval with Eylea.  Monocular patient will continue to maintain aggressive treatment schedule w ?

## 2022-03-27 NOTE — Assessment & Plan Note (Signed)
No sign of CNVM OD 

## 2022-03-27 NOTE — Progress Notes (Signed)
? ? ?03/27/2022 ? ?  ? ?CHIEF COMPLAINT ?Patient presents for  ?Chief Complaint  ?Patient presents with  ? Macular Degeneration  ? ? ? ? ?HISTORY OF PRESENT ILLNESS: ?Cristina Moore is a 51 y.o. female who presents to the clinic today for:  ? ?HPI   ?9 weeks for DILATE OU, EYLEA OCT, OS. ?Pt stated vision is stable. ?Pt denies new onset floaters and FOL. ? ? ? ?Last edited by Silvestre Moment on 03/27/2022  9:32 AM.  ?  ? ? ?Referring physician: ?Kelton Pillar, MD ?[place] ?[address] ?[place],  [place] [address] ? ?HISTORICAL INFORMATION:  ? ?Selected notes from the [facility] ?  ?   ? ?CURRENT MEDICATIONS: ?No current outpatient medications on file. (Ophthalmic Drugs)  ? ?No current facility-administered medications for this visit. (Ophthalmic Drugs)  ? ?Current Outpatient Medications (Other)  ?Medication Sig  ? aspirin 81 MG tablet Take 81 mg by mouth daily.  ? atorvastatin (LIPITOR) 40 MG tablet Take 40 mg by mouth daily.  ? buPROPion (ZYBAN) 150 MG 12 hr tablet Take 150 mg by mouth 2 (two) times daily.  ? glimepiride (AMARYL) 2 MG tablet Take 2 mg by mouth daily with breakfast.  ? lisinopril (PRINIVIL,ZESTRIL) 20 MG tablet Take 20 mg by mouth daily.  ? metFORMIN (GLUCOPHAGE) 500 MG tablet Take by mouth 2 (two) times daily with a meal. Takes 1& 1/2 tables twice daily with meals  ? Multiple Vitamin (MULTI-VITAMIN DAILY PO) Take by mouth.  ? ?No current facility-administered medications for this visit. (Other)  ? ? ? ? ?REVIEW OF SYSTEMS: ?ROS   ?Negative for: Constitutional, Gastrointestinal, Neurological, Skin, Genitourinary, Musculoskeletal, HENT, Endocrine, Cardiovascular, Eyes, Respiratory, Psychiatric, Allergic/Imm, Heme/Lymph ?Last edited by Silvestre Moment on 03/27/2022  9:32 AM.  ?  ? ? ? ?ALLERGIES ?No Known Allergies ? ?PAST MEDICAL HISTORY ?Past Medical History:  ?Diagnosis Date  ? Depression   ? Diabetes mellitus without complication ([place])   ? Hyperlipidemia   ? Hypertension   ? Macular degeneration   ?  PCOS (polycystic ovarian syndrome)   ? Proteinuria   ? ?Past Surgical History:  ?Procedure Laterality Date  ? BREAST BIOPSY Left 03/2017  ? PARTIAL HYSTERECTOMY    ? ? ?FAMILY HISTORY ?Family History  ?Problem Relation Age of Onset  ? Hyperlipidemia Father   ? Hypertension Father   ? Breast cancer Sister 105  ? ? ?SOCIAL HISTORY ?Social History  ? ?Tobacco Use  ? Smoking status: Never  ? Smokeless tobacco: Never  ? ?  ? ?  ? ?OPHTHALMIC EXAM: ? ?Base Eye Exam   ? ? Visual Acuity (ETDRS)   ? ?   Right Left  ? Dist [place] 20/150 20/40  ? Dist ph [place] NI 20/30 -1  ? ? Correction: Contacts  ? ?  ?  ? ? Tonometry (Tonopen, 9:40 AM)   ? ?   Right Left  ? Pressure 14 13  ? ?  ?  ? ? Pupils   ? ?   Pupils APD  ? Right PERRL None  ? Left PERRL None  ? ?  ?  ? ? Visual Fields   ? ?   Left Right  ?  Full Full  ? ?  ?  ? ? Extraocular Movement   ? ?   Right Left  ?  Full Full  ? ?  ?  ? ? Neuro/Psych   ? ? Oriented x3: Yes  ? Mood/Affect: Normal  ? ?  ?  ? ?  Dilation   ? ? Both eyes: 1.0% Mydriacyl, 2.5% Phenylephrine @ 9:40 AM  ? ?  ?  ? ?  ? ?Slit Lamp and Fundus Exam   ? ? External Exam   ? ?   Right Left  ? External Normal Normal  ? ?  ?  ? ? Slit Lamp Exam   ? ?   Right Left  ? Lids/Lashes Normal Normal  ? Conjunctiva/Sclera White and quiet White and quiet  ? Cornea Clear Clear  ? Anterior Chamber Deep and quiet Deep and quiet  ? Iris Round and reactive Round and reactive  ? Lens 2+ Nuclear sclerosis 2+ Nuclear sclerosis, with moderate color change yellow-green which might explain her night vision difficultiesand central small PSC but not visually significant yet  ? Anterior Vitreous Normal Normal  ? ?  ?  ? ? Fundus Exam   ? ?   Right Left  ? Posterior Vitreous  Posterior vitreous detachment, Central vitreous floaters  ? Disc  Peripapillary atrophy  ? C/D Ratio  0.35  ? Macula  Geographic atrophy splits the FAZ, no hemorrhage, no macular thickening  ? Vessels  Normal  ? Periphery  Normal,, no holes or tears  ? ?  ?  ? ?   ? ? ?IMAGING AND PROCEDURES  ?Imaging and Procedures for 03/27/22 ? ?OCT, Retina - OU - Both Eyes   ? ?   ?Right Eye ?Quality was good. Scan locations included subfoveal. Findings include myopic contour, abnormal foveal contour, subretinal hyper-reflective material, disciform scar.  ? ?Left Eye ?Quality was borderline. Scan locations included subfoveal. Central Foveal Thickness: 281. Progression has been stable. Findings include abnormal foveal contour, myopic contour, disciform scar.  ? ?Notes ?Paracentral disciform scar left eye no change, no intraretinal migration, no intraretinal fluid no subretinal fluid will continue to observe progression, currently at 9-week post injection Eylea OS ? ?  ? ?Intravitreal Injection, Pharmacologic Agent - OS - Left Eye   ? ?   ?Time Out ?03/27/2022. 10:58 AM. Confirmed correct patient, procedure, site, and patient consented.  ? ?Anesthesia ?Topical anesthesia was used. Anesthetic medications included Lidocaine 4%.  ? ?Procedure ?Preparation included 10% betadine to eyelids, 5% betadine to ocular surface. A 30 gauge needle was used.  ? ?Injection: ?2 mg aflibercept 2 MG/0.05ML ?  Route: Intravitreal, Site: Left Eye ?  [place]: [id number], Lot: [id number], Waste: 0 mL  ? ?Post-op ?Post injection exam found visual acuity of at least counting fingers. The patient tolerated the procedure well. There were no complications. The patient received written and verbal post procedure care education. Post injection medications were not given.  ? ?  ? ? ?  ?  ? ?  ?ASSESSMENT/PLAN: ? ?Exudative age-related macular degeneration of left eye with active choroidal neovascularization ([place]) ?Stabilized and inactive disease juxta foveal from CNVM likely related to previous OHS syndrome. We will continue to monitor and treat  ?Today at 9-week interval with Eylea.  Monocular patient will continue to maintain aggressive treatment schedule w ? ?Advanced nonexudative age-related macular degeneration of  right eye with subfoveal involvement ?No sign of CNVM OD ? 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 ?Nuclear sclerotic cataract of both eyes ?Mild OU  ? ?  ICD-10-CM   ?1. Exudative age-related macular degeneration of left eye with active choroidal neovascularization (HCC)  H35.3221 OCT, Retina - OU - Both Eyes  ?  Intravitreal Injection, Pharmacologic Agent - OS - Left Eye  ?  aflibercept (EYLEA) SOLN 2 mg  ?  ?2. Advanced nonexudative age-related macular degeneration of right eye with subfoveal involvement  H35.3114   ?  ?3. 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 H32   ?  ?4. Nuclear sclerotic cataract of both eyes  H25.13   ?  ? ? ?1.  OS, doing very well at 9-week interval no recurrence of juxta foveal CNVM repeat injection today and this monocular patient ? ?2. ? ?3. ? ?Ophthalmic Meds Ordered this visit:  ?Meds ordered this encounter  ?Medications  ? aflibercept (EYLEA) SOLN 2 mg  ? ? ?  ? ?Return in about 9 weeks (around 05/29/2022) for dilate, OS, EYLEA OCT. ? ?There are no Patient Instructions on file for this visit. ? ? ?Explained the diagnoses, plan, and follow up with the patient and they expressed understanding.  Patient expressed understanding of the importance of proper follow up care.  ? ?Clent Demark. Alando Colleran M.D. ?Diseases & Surgery of the Retina and Vitreous ?[facility] ?03/27/22 ? ? ? ? ?Abbreviations: ?M myopia (nearsighted); A astigmatism; H hyperopia (farsighted); P presbyopia; Mrx spectacle prescription;  CTL contact lenses; OD right eye; OS left eye; OU both eyes  XT exotropia; ET esotropia; PEK punctate epithelial keratitis; PEE punctate epithelial erosions; DES dry eye syndrome; MGD meibomian gland dysfunction; ATs artificial tears; PFAT's preservative free artificial tears; [place] nuclear sclerotic cataract; PSC posterior subcapsular cataract; ERM epi-retinal membrane; PVD posterior vitreous detachment;  RD retinal detachment; DM diabetes mellitus; DR diabetic retinopathy; NPDR non-proliferative diabetic retinopathy; PDR proliferative diabetic retinopathy; CSME clinically significant macular edema; DME diabetic mac

## 2022-03-27 NOTE — Assessment & Plan Note (Signed)
Mild OU 

## 2022-03-27 NOTE — Assessment & Plan Note (Signed)
No active inflammation 

## 2022-04-23 ENCOUNTER — Other Ambulatory Visit: Payer: Self-pay | Admitting: Family Medicine

## 2022-04-23 DIAGNOSIS — Z1231 Encounter for screening mammogram for malignant neoplasm of breast: Secondary | ICD-10-CM

## 2022-05-29 ENCOUNTER — Ambulatory Visit (INDEPENDENT_AMBULATORY_CARE_PROVIDER_SITE_OTHER): Payer: BC Managed Care – PPO | Admitting: Ophthalmology

## 2022-05-29 ENCOUNTER — Encounter (INDEPENDENT_AMBULATORY_CARE_PROVIDER_SITE_OTHER): Payer: Self-pay | Admitting: Ophthalmology

## 2022-05-29 ENCOUNTER — Encounter (INDEPENDENT_AMBULATORY_CARE_PROVIDER_SITE_OTHER): Payer: BC Managed Care – PPO | Admitting: Ophthalmology

## 2022-05-29 DIAGNOSIS — H353212 Exudative age-related macular degeneration, right eye, with inactive choroidal neovascularization: Secondary | ICD-10-CM

## 2022-05-29 DIAGNOSIS — H353221 Exudative age-related macular degeneration, left eye, with active choroidal neovascularization: Secondary | ICD-10-CM

## 2022-05-29 DIAGNOSIS — H353114 Nonexudative age-related macular degeneration, right eye, advanced atrophic with subfoveal involvement: Secondary | ICD-10-CM | POA: Diagnosis not present

## 2022-05-29 MED ORDER — AFLIBERCEPT 2MG/0.05ML IZ SOLN FOR KALEIDOSCOPE
2.0000 mg | INTRAVITREAL | Status: AC | PRN
  Administered 2022-05-29: 2 mg via INTRAVITREAL

## 2022-05-29 NOTE — Assessment & Plan Note (Signed)
Accounts for acuity 

## 2022-05-29 NOTE — Assessment & Plan Note (Signed)
Condition remained stable at 9 weeks history of multiple recurrences on Eylea today repeat Eylea today

## 2022-05-29 NOTE — Assessment & Plan Note (Signed)
No active disciform OD continue to monitor and observe

## 2022-05-29 NOTE — Progress Notes (Signed)
05/29/2022     CHIEF COMPLAINT Patient presents for  Chief Complaint  Patient presents with   Macular Degeneration      HISTORY OF PRESENT ILLNESS: Cristina Moore is a 51 y.o. female who presents to the clinic today for:   HPI   9 weeks for DILATE OS, EYLEA, OCT. Pt stated vision has not changed since last visit. Pt has been taking Trulicity for 2 years.  Last edited by Angeline Slim on 05/29/2022  2:48 PM.      Referring physician: Maurice Small, MD [address] [place],  [place] [address]  HISTORICAL INFORMATION:   Selected notes from the MEDICAL RECORD NUMBER       CURRENT MEDICATIONS: No current outpatient medications on file. (Ophthalmic Drugs)   No current facility-administered medications for this visit. (Ophthalmic Drugs)   Current Outpatient Medications (Other)  Medication Sig   aspirin 81 MG tablet Take 81 mg by mouth daily.   atorvastatin (LIPITOR) 40 MG tablet Take 40 mg by mouth daily.   buPROPion (ZYBAN) 150 MG 12 hr tablet Take 150 mg by mouth 2 (two) times daily.   glimepiride (AMARYL) 2 MG tablet Take 2 mg by mouth daily with breakfast.   lisinopril (PRINIVIL,ZESTRIL) 20 MG tablet Take 20 mg by mouth daily.   metFORMIN (GLUCOPHAGE) 500 MG tablet Take by mouth 2 (two) times daily with a meal. Takes 1& 1/2 tables twice daily with meals   Multiple Vitamin (MULTI-VITAMIN DAILY PO) Take by mouth.   No current facility-administered medications for this visit. (Other)      REVIEW OF SYSTEMS: ROS   Negative for: Constitutional, Gastrointestinal, Neurological, Skin, Genitourinary, Musculoskeletal, HENT, Endocrine, Cardiovascular, Eyes, Respiratory, Psychiatric, Allergic/Imm, Heme/Lymph Last edited by Angeline Slim on 05/29/2022  2:48 PM.       ALLERGIES No Known Allergies  PAST MEDICAL HISTORY Past Medical History:  Diagnosis Date   Depression    Diabetes mellitus without complication (HCC)    Hyperlipidemia    Hypertension     Macular degeneration    PCOS (polycystic ovarian syndrome)    Proteinuria    Past Surgical History:  Procedure Laterality Date   BREAST BIOPSY Left 03/2017   PARTIAL HYSTERECTOMY      FAMILY HISTORY Family History  Problem Relation Age of Onset   Hyperlipidemia Father    Hypertension Father    Breast cancer Sister 73    SOCIAL HISTORY Social History   Tobacco Use   Smoking status: Never   Smokeless tobacco: Never         OPHTHALMIC EXAM:  Base Eye Exam     Visual Acuity (ETDRS)       Right Left   Dist cc 20/200 20/50 -2   Dist ph cc 20/150 20/30 +1    Correction: Contacts         Tonometry (Tonopen, 2:55 PM)       Right Left   Pressure 14 16         Pupils       Pupils APD   Right PERRL None   Left PERRL None         Visual Fields       Left Right    Full Full         Extraocular Movement       Right Left    Full Full         Neuro/Psych     Oriented x3: Yes  Mood/Affect: Normal         Dilation     Left eye: 2.5% Phenylephrine, 1.0% Mydriacyl @ 2:55 PM           Slit Lamp and Fundus Exam     External Exam       Right Left   External Normal Normal         Slit Lamp Exam       Right Left   Lids/Lashes Normal Normal   Conjunctiva/Sclera White and quiet White and quiet   Cornea Clear Clear   Anterior Chamber Deep and quiet Deep and quiet   Iris Round and reactive Round and reactive   Lens 2+ Nuclear sclerosis 2+ Nuclear sclerosis, with moderate color change yellow-green which might explain her night vision difficultiesand central small PSC but not visually significant yet   Anterior Vitreous Normal Normal         Fundus Exam       Right Left   Posterior Vitreous  Posterior vitreous detachment, Central vitreous floaters   Disc  Peripapillary atrophy   C/D Ratio  0.35   Macula  Geographic atrophy splits the FAZ, no hemorrhage, no macular thickening   Vessels  Normal   Periphery  Normal,, no holes  or tears            IMAGING AND PROCEDURES  Imaging and Procedures for 05/29/22  OCT, Retina - OU - Both Eyes       Right Eye Quality was good. Scan locations included subfoveal. Central Foveal Thickness: 408. Findings include abnormal foveal contour, myopic contour, subretinal hyper-reflective material, disciform scar.   Left Eye Quality was borderline. Scan locations included subfoveal. Central Foveal Thickness: 306. Progression has been stable. Findings include abnormal foveal contour, myopic contour, disciform scar.   Notes Paracentral disciform scar left eye no change, no intraretinal migration, no intraretinal fluid no subretinal fluid will continue to observe progression, currently at 9-week post injection Eylea OS      Intravitreal Injection, Pharmacologic Agent - OS - Left Eye       Time Out 05/29/2022. 3:52 PM. Confirmed correct patient, procedure, site, and patient consented.   Anesthesia Topical anesthesia was used. Anesthetic medications included Lidocaine 4%.   Procedure Preparation included 5% betadine to ocular surface, 10% betadine to eyelids. A 30 gauge needle was used.   Injection: 2 mg aflibercept 2 MG/0.05ML   Route: Intravitreal, Site: Left Eye   NDC: [id number], Lot: [id number], Expiration date: 01/30/2023, Waste: 0 mL   Post-op Post injection exam found visual acuity of at least counting fingers. The patient tolerated the procedure well. There were no complications. The patient received written and verbal post procedure care education. Post injection medications were not given.              ASSESSMENT/PLAN:  Exudative age-related macular degeneration of right eye with inactive choroidal neovascularization (HCC) No active disciform OD continue to monitor and observe  Exudative age-related macular degeneration of left eye with active choroidal neovascularization (HCC) Condition remained stable at 9 weeks history of multiple recurrences  on Eylea today repeat Eylea today  Advanced nonexudative age-related macular degeneration of right eye with subfoveal involvement Accounts for acuity     ICD-10-CM   1. Exudative age-related macular degeneration of left eye with active choroidal neovascularization (HCC)  H35.3221 OCT, Retina - OU - Both Eyes    Intravitreal Injection, Pharmacologic Agent - OS - Left Eye    aflibercept (EYLEA) SOLN 2 mg  2. Exudative age-related macular degeneration of right eye with inactive choroidal neovascularization (HCC)  H35.3212     3. 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  Repeat injection Eylea today to maintain  2.  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  Ophthalmic Meds Ordered this visit:  Meds ordered this encounter  Medications   aflibercept (EYLEA) SOLN 2 mg       Return in about 9 weeks (around 07/31/2022) for dilate, OS, EYLEA OCT.  There are no Patient Instructions on file for this visit.   Explained the diagnoses, plan, and follow up with the patient and they expressed understanding.  Patient expressed understanding of the importance of proper follow up care.   Alford Highland Candy Ziegler M.D. Diseases & Surgery of the Retina and Vitreous [facility] 05/29/22     Abbreviations: M myopia (nearsighted); A astigmatism; H hyperopia (farsighted); P presbyopia; Mrx spectacle prescription;  CTL contact lenses; OD right eye; OS left eye; OU both eyes  XT exotropia; ET esotropia; PEK punctate epithelial keratitis; PEE punctate epithelial erosions; DES dry eye syndrome; MGD meibomian gland dysfunction; ATs artificial tears; PFAT's preservative free artificial tears; NSC nuclear sclerotic cataract; PSC posterior subcapsular cataract; ERM epi-retinal membrane; PVD posterior vitreous detachment; RD retinal detachment;  DM diabetes mellitus; DR diabetic retinopathy; NPDR non-proliferative diabetic retinopathy; PDR proliferative diabetic retinopathy; CSME clinically significant macular edema; DME diabetic macular edema; dbh dot blot hemorrhages; CWS cotton wool spot; POAG primary open angle glaucoma; C/D cup-to-disc ratio; HVF humphrey visual field; GVF goldmann visual field; OCT optical coherence tomography; IOP intraocular pressure; BRVO Branch retinal vein occlusion; CRVO central retinal vein occlusion; CRAO central retinal artery occlusion; BRAO branch retinal artery occlusion; RT retinal tear; SB scleral buckle; PPV pars plana vitrectomy; VH Vitreous hemorrhage; PRP panretinal laser photocoagulation; IVK intravitreal kenalog; VMT vitreomacular traction; MH Macular hole;  NVD neovascularization of the disc; NVE neovascularization elsewhere; AREDS age related eye disease study; ARMD age related macular degeneration; POAG primary open angle glaucoma; EBMD epithelial/anterior basement membrane dystrophy; ACIOL anterior chamber intraocular lens; IOL intraocular lens; PCIOL posterior chamber intraocular lens; Phaco/IOL phacoemulsification with intraocular lens placement; PRK photorefractive keratectomy; LASIK laser assisted in situ keratomileusis; HTN hypertension; DM diabetes mellitus; COPD chronic obstructive pulmonary disease

## 2022-06-24 ENCOUNTER — Ambulatory Visit
Admission: RE | Admit: 2022-06-24 | Discharge: 2022-06-24 | Disposition: A | Discharge status: Home / Self Care | Payer: BC Managed Care – PPO | Source: Ambulatory Visit | Attending: Family Medicine | Admitting: Family Medicine

## 2022-06-24 DIAGNOSIS — Z1231 Encounter for screening mammogram for malignant neoplasm of breast: Secondary | ICD-10-CM

## 2022-07-31 ENCOUNTER — Encounter (INDEPENDENT_AMBULATORY_CARE_PROVIDER_SITE_OTHER): Payer: Self-pay | Admitting: Ophthalmology

## 2022-07-31 ENCOUNTER — Ambulatory Visit (INDEPENDENT_AMBULATORY_CARE_PROVIDER_SITE_OTHER): Payer: BC Managed Care – PPO | Admitting: Ophthalmology

## 2022-07-31 DIAGNOSIS — H3530 Unspecified macular degeneration: Secondary | ICD-10-CM

## 2022-07-31 DIAGNOSIS — H2513 Age-related nuclear cataract, bilateral: Secondary | ICD-10-CM | POA: Diagnosis not present

## 2022-07-31 DIAGNOSIS — H353221 Exudative age-related macular degeneration, left eye, with active choroidal neovascularization: Secondary | ICD-10-CM | POA: Diagnosis not present

## 2022-07-31 DIAGNOSIS — H353212 Exudative age-related macular degeneration, right eye, with inactive choroidal neovascularization: Secondary | ICD-10-CM | POA: Diagnosis not present

## 2022-07-31 MED ORDER — AFLIBERCEPT 2MG/0.05ML IZ SOLN FOR KALEIDOSCOPE
2.0000 mg | INTRAVITREAL | Status: AC | PRN
  Administered 2022-07-31: 2 mg via INTRAVITREAL

## 2022-07-31 NOTE — Assessment & Plan Note (Signed)
Contributory to underlying condition

## 2022-07-31 NOTE — Assessment & Plan Note (Signed)
No active disease today.  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

## 2022-07-31 NOTE — Progress Notes (Signed)
07/31/2022     CHIEF COMPLAINT Patient presents for  Chief Complaint  Patient presents with   Macular Degeneration      HISTORY OF PRESENT ILLNESS: Cristina Moore is a 51 y.o. female who presents to the clinic today for:   HPI   9 weeks for DILATE OS, EYLEA OCT. Pt stated no vision changes.  Last edited by Angeline Slim on 07/31/2022  2:26 PM.      Referring physician: Maurice Small, MD [address] [place],  [place] [address]  HISTORICAL INFORMATION:   Selected notes from the MEDICAL RECORD NUMBER       CURRENT MEDICATIONS: No current outpatient medications on file. (Ophthalmic Drugs)   No current facility-administered medications for this visit. (Ophthalmic Drugs)   Current Outpatient Medications (Other)  Medication Sig   aspirin 81 MG tablet Take 81 mg by mouth daily.   atorvastatin (LIPITOR) 40 MG tablet Take 40 mg by mouth daily.   buPROPion (ZYBAN) 150 MG 12 hr tablet Take 150 mg by mouth 2 (two) times daily.   glimepiride (AMARYL) 2 MG tablet Take 2 mg by mouth daily with breakfast.   lisinopril (PRINIVIL,ZESTRIL) 20 MG tablet Take 20 mg by mouth daily.   metFORMIN (GLUCOPHAGE) 500 MG tablet Take by mouth 2 (two) times daily with a meal. Takes 1& 1/2 tables twice daily with meals   Multiple Vitamin (MULTI-VITAMIN DAILY PO) Take by mouth.   No current facility-administered medications for this visit. (Other)      REVIEW OF SYSTEMS: ROS   Negative for: Constitutional, Gastrointestinal, Neurological, Skin, Genitourinary, Musculoskeletal, HENT, Endocrine, Cardiovascular, Eyes, Respiratory, Psychiatric, Allergic/Imm, Heme/Lymph Last edited by Angeline Slim on 07/31/2022  2:26 PM.       ALLERGIES No Known Allergies  PAST MEDICAL HISTORY Past Medical History:  Diagnosis Date   Depression    Diabetes mellitus without complication (HCC)    Hyperlipidemia    Hypertension    Macular degeneration    PCOS (polycystic ovarian syndrome)     Proteinuria    Past Surgical History:  Procedure Laterality Date   BREAST BIOPSY Left 03/2017   PARTIAL HYSTERECTOMY      FAMILY HISTORY Family History  Problem Relation Age of Onset   Hyperlipidemia Father    Hypertension Father    Breast cancer Sister 45    SOCIAL HISTORY Social History   Tobacco Use   Smoking status: Never   Smokeless tobacco: Never         OPHTHALMIC EXAM:  Base Eye Exam     Visual Acuity (ETDRS)       Right Left   Dist cc 20/200 20/50 -2   Dist ph cc 20/150 20/25 -1    Correction: Contacts         Tonometry (Tonopen, 2:32 PM)       Right Left   Pressure 13 15         Pupils       Pupils APD   Right PERRL None   Left PERRL None         Visual Fields       Left Right    Full Full         Extraocular Movement       Right Left    Full, Ortho Full, Ortho         Neuro/Psych     Oriented x3: Yes   Mood/Affect: Normal  Dilation     Left eye: 2.5% Phenylephrine, 1.0% Mydriacyl @ 2:32 PM           Slit Lamp and Fundus Exam     External Exam       Right Left   External Normal Normal         Slit Lamp Exam       Right Left   Lids/Lashes Normal Normal   Conjunctiva/Sclera White and quiet White and quiet   Cornea Clear Clear   Anterior Chamber Deep and quiet Deep and quiet   Iris Round and reactive Round and reactive   Lens 2+ Nuclear sclerosis 2+ Nuclear sclerosis, with moderate color change yellow-green which might explain her night vision difficultiesand central small PSC but not visually significant yet   Anterior Vitreous Normal Normal         Fundus Exam       Right Left   Posterior Vitreous  Posterior vitreous detachment, Central vitreous floaters   Disc  Peripapillary atrophy   C/D Ratio  0.35   Macula  Geographic atrophy splits the FAZ, no hemorrhage, no macular thickening   Vessels  Normal   Periphery  Normal,, no holes or tears            IMAGING AND  PROCEDURES  Imaging and Procedures for 07/31/22  OCT, Retina - OU - Both Eyes       Right Eye Quality was good. Scan locations included subfoveal. Central Foveal Thickness: 408. Findings include abnormal foveal contour, myopic contour, subretinal hyper-reflective material, disciform scar.   Left Eye Quality was poor. Scan locations included subfoveal. Central Foveal Thickness: 306. Progression has been stable. Findings include abnormal foveal contour, myopic contour, disciform scar.   Notes Paracentral disciform scar left eye no change, no intraretinal migration, no intraretinal fluid no subretinal fluid will continue to observe progression, currently at 9-week post injection Eylea OS  Media opacity OS     Intravitreal Injection, Pharmacologic Agent - OS - Left Eye       Time Out 07/31/2022. 2:45 PM. Confirmed correct patient, procedure, site, and patient consented.   Anesthesia Topical anesthesia was used. Anesthetic medications included Lidocaine 4%.   Procedure Preparation included 5% betadine to ocular surface, 10% betadine to eyelids. A 30 gauge needle was used.   Injection: 2 mg aflibercept 2 MG/0.05ML   Route: Intravitreal, Site: Left Eye   NDC: [id number], Lot: [id number], Expiration date: 03/02/2023, Waste: 0 mL   Post-op Post injection exam found visual acuity of at least counting fingers. The patient tolerated the procedure well. There were no complications. The patient received written and verbal post procedure care education. Post injection medications were not given.              ASSESSMENT/PLAN:  Exudative age-related macular degeneration of left eye with active choroidal neovascularization (HCC) OS stable and history of multiple recurrences with observation alone.  Currently at 9 weeks post Eylea injection.  Repeat injection today  Exudative age-related macular degeneration of right eye with inactive choroidal neovascularization (HCC) No active  disease today.  Some component of myopic macular degeneration in the past  Myopic macular degeneration of both eyes Contributory to underlying condition  Nuclear sclerotic cataract of both eyes Progressing OU      ICD-10-CM   1. Exudative age-related macular degeneration of left eye with active choroidal neovascularization (HCC)  H35.3221 OCT, Retina - OU - Both Eyes    Intravitreal Injection, Pharmacologic Agent - OS -  Left Eye    aflibercept (EYLEA) SOLN 2 mg    2. Exudative age-related macular degeneration of right eye with inactive choroidal neovascularization (HCC)  H35.3212     3. Myopic macular degeneration of both eyes  H35.30     4. Nuclear sclerotic cataract of both eyes  H25.13       1.  OS doing well.  Well-controlled CNVM juxta foveal in the past.  History of multiple recurrences.  On Eylea.  At 9-week interval.  Repeat injection today  2.  3.  Ophthalmic Meds Ordered this visit:  Meds ordered this encounter  Medications   aflibercept (EYLEA) SOLN 2 mg       Return in about 9 weeks (around 10/02/2022) for dilate, OS, EYLEA OCT.  There are no Patient Instructions on file for this visit.   Explained the diagnoses, plan, and follow up with the patient and they expressed understanding.  Patient expressed understanding of the importance of proper follow up care.   Alford Highland Shela Esses M.D. Diseases & Surgery of the Retina and Vitreous [facility] 07/31/22     Abbreviations: M myopia (nearsighted); A astigmatism; H hyperopia (farsighted); P presbyopia; Mrx spectacle prescription;  CTL contact lenses; OD right eye; OS left eye; OU both eyes  XT exotropia; ET esotropia; PEK punctate epithelial keratitis; PEE punctate epithelial erosions; DES dry eye syndrome; MGD meibomian gland dysfunction; ATs artificial tears; PFAT's preservative free artificial tears; NSC nuclear sclerotic cataract; PSC posterior subcapsular cataract; ERM epi-retinal membrane;  PVD posterior vitreous detachment; RD retinal detachment; DM diabetes mellitus; DR diabetic retinopathy; NPDR non-proliferative diabetic retinopathy; PDR proliferative diabetic retinopathy; CSME clinically significant macular edema; DME diabetic macular edema; dbh dot blot hemorrhages; CWS cotton wool spot; POAG primary open angle glaucoma; C/D cup-to-disc ratio; HVF humphrey visual field; GVF goldmann visual field; OCT optical coherence tomography; IOP intraocular pressure; BRVO Branch retinal vein occlusion; CRVO central retinal vein occlusion; CRAO central retinal artery occlusion; BRAO branch retinal artery occlusion; RT retinal tear; SB scleral buckle; PPV pars plana vitrectomy; VH Vitreous hemorrhage; PRP panretinal laser photocoagulation; IVK intravitreal kenalog; VMT vitreomacular traction; MH Macular hole;  NVD neovascularization of the disc; NVE neovascularization elsewhere; AREDS age related eye disease study; ARMD age related macular degeneration; POAG primary open angle glaucoma; EBMD epithelial/anterior basement membrane dystrophy; ACIOL anterior chamber intraocular lens; IOL intraocular lens; PCIOL posterior chamber intraocular lens; Phaco/IOL phacoemulsification with intraocular lens placement; PRK photorefractive keratectomy; LASIK laser assisted in situ keratomileusis; HTN hypertension; DM diabetes mellitus; COPD chronic obstructive pulmonary disease

## 2022-07-31 NOTE — Assessment & Plan Note (Signed)
OS stable and history of multiple recurrences with observation alone.  Currently at 9 weeks post Eylea injection.  Repeat injection today

## 2022-07-31 NOTE — Assessment & Plan Note (Signed)
Progressing OU 

## 2022-10-02 ENCOUNTER — Encounter (INDEPENDENT_AMBULATORY_CARE_PROVIDER_SITE_OTHER): Payer: BC Managed Care – PPO | Admitting: Ophthalmology

## 2023-04-09 ENCOUNTER — Other Ambulatory Visit: Payer: Self-pay | Admitting: Internal Medicine

## 2023-04-09 DIAGNOSIS — Z1231 Encounter for screening mammogram for malignant neoplasm of breast: Secondary | ICD-10-CM

## 2023-06-29 ENCOUNTER — Ambulatory Visit
Admission: RE | Admit: 2023-06-29 | Discharge: 2023-06-29 | Disposition: A | Discharge status: Home / Self Care | Payer: BC Managed Care – PPO | Source: Ambulatory Visit | Attending: Internal Medicine | Admitting: Internal Medicine

## 2023-06-29 DIAGNOSIS — Z1231 Encounter for screening mammogram for malignant neoplasm of breast: Secondary | ICD-10-CM

## 2024-05-20 ENCOUNTER — Other Ambulatory Visit: Payer: Self-pay | Admitting: Internal Medicine

## 2024-05-20 DIAGNOSIS — Z1231 Encounter for screening mammogram for malignant neoplasm of breast: Secondary | ICD-10-CM

## 2024-06-18 ENCOUNTER — Ambulatory Visit
Admission: EM | Admit: 2024-06-18 | Discharge: 2024-06-18 | Disposition: A | Discharge status: Home / Self Care | Attending: Emergency Medicine | Admitting: Emergency Medicine

## 2024-06-18 DIAGNOSIS — R350 Frequency of micturition: Secondary | ICD-10-CM

## 2024-06-18 LAB — POCT URINALYSIS DIP (MANUAL ENTRY)
Bilirubin, UA: NEGATIVE
Glucose, UA: NEGATIVE mg/dL
Ketones, POC UA: NEGATIVE mg/dL
Leukocytes, UA: NEGATIVE
Nitrite, UA: NEGATIVE
Protein Ur, POC: NEGATIVE mg/dL
Spec Grav, UA: 1.01 (ref 1.010–1.025)
Urobilinogen, UA: 0.2 U/dL
pH, UA: 5.5 (ref 5.0–8.0)

## 2024-06-18 MED ORDER — NITROFURANTOIN MONOHYD MACRO 100 MG PO CAPS: 100.0000 mg | ORAL_CAPSULE | Freq: Two times a day (BID) | ORAL | 0 refills | Status: AC

## 2024-06-18 NOTE — ED Provider Notes (Signed)
 [facility]    CSN: [id number] Arrival date & time: 06/18/24  0854      History   Chief Complaint Chief Complaint  Patient presents with   Urinary Frequency   Dysuria    HPI Cristina Moore is a 53 y.o. female.   Patient presents for evaluation of urinary frequency, hematuria and urethral pressure present for 3 days.  Denies dysuria, flank pain, fever or vaginal symptoms.  Has attempted use of cranberry.  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) 05/07/2021   Myopic macular degeneration of both eyes 10/01/2020   Posterior vitreous detachment of left eye 04/23/2020   Exudative age-related macular degeneration of left eye with active choroidal neovascularization (HCC) 04/05/2020   Intermediate stage nonexudative age-related macular degeneration of left eye 04/05/2020   Advanced nonexudative age-related macular degeneration of right eye with subfoveal involvement 04/05/2020   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      Home Medications    Prior to Admission medications   Medication Sig Start Date End Date Taking? Authorizing Provider  atorvastatin (LIPITOR) 40 MG tablet Take 40 mg by mouth daily.   Yes [provider]  buPROPion (ZYBAN) 150 MG 12 hr tablet Take 150 mg by mouth 2 (two) times daily.   Yes [provider]  glimepiride (AMARYL) 2 MG tablet Take 2 mg by mouth  daily with breakfast.   Yes [provider]  lisinopril (PRINIVIL,ZESTRIL) 20 MG tablet Take 20 mg by mouth daily.   Yes [provider]  metFORMIN (GLUCOPHAGE) 500 MG tablet Take by mouth 2 (two) times daily with a meal. Takes 1& 1/2 tables twice daily with meals   Yes [provider]  MOUNJARO 2.5 MG/0.5ML Pen Inject into the skin once a week.   Yes [provider]  nitrofurantoin , macrocrystal-monohydrate, (MACROBID ) 100 MG capsule Take 1 capsule (100 mg total) by mouth 2 (two) times daily. 06/18/24  Yes Teresa Price R, NP  aspirin 81 MG tablet Take 81 mg by mouth daily.    [provider]  Multiple Vitamin (MULTI-VITAMIN DAILY PO) Take by mouth.    [provider]    Family History Family History  Problem Relation Age of Onset   Hyperlipidemia Father    Hypertension Father    Breast cancer Sister 18    Social History Social History   Tobacco Use   Smoking status: Never   Smokeless tobacco: Never     Allergies   Patient has no known allergies.   Review of Systems Review of Systems   Physical Exam Triage Vital Signs ED Triage Vitals [06/18/24 0909]  Encounter Vitals Group     BP 108/72     Girls Systolic BP Percentile      Girls Diastolic BP Percentile      Boys Systolic BP Percentile      Boys  Diastolic BP Percentile      Pulse Rate 80     Resp 18     Temp 98.6 F (37 C)     Temp Source Oral     SpO2 97 %     Weight      Height      Head Circumference      Peak Flow      Pain Score      Pain Loc      Pain Education      Exclude from Growth Chart    No data found.  Updated Vital Signs BP 108/72 (BP Location: Left Arm)   Pulse 80   Temp 98.6 F (37 C) (Oral)   Resp 18   SpO2 97%   Visual Acuity Right Eye Distance:   Left Eye Distance:   Bilateral Distance:    Right Eye Near:   Left Eye Near:    Bilateral Near:     Physical Exam Constitutional:      Appearance: Normal appearance.   Eyes:     Extraocular Movements: Extraocular movements intact.  Pulmonary:     Effort: Pulmonary effort is normal.  Abdominal:     Tenderness: There is no abdominal tenderness. There is no right CVA tenderness, left CVA tenderness or guarding.  Neurological:     Mental Status: She is alert and oriented to person, place, and time.      UC Treatments / Results  Labs (all labs ordered are listed, but only abnormal results are displayed) Labs Reviewed  POCT URINALYSIS DIP (MANUAL ENTRY) - Abnormal; Notable for the following components:      Result Value   Blood, UA trace-lysed (*)    All other components within normal limits  URINE CULTURE    EKG   Radiology No results found.  Procedures Procedures (including critical care time)  Medications Ordered in UC Medications - No data to display  Initial Impression / Assessment and Plan / UC Course  I have reviewed the triage vital signs and the nursing notes.  Pertinent labs & imaging results that were available during my care of the patient were reviewed by me and considered in my medical decision making (see chart for details).  Urinary frequency  Urinalysis negative, sent for culture, empirically placed on Macrobid  as she is symptomatic, recommended over-the-counter medications and nonpharmacological supportive care with follow-up as needed Final Clinical Impressions(s) / UC Diagnoses   Final diagnoses:  Urinary frequency     Discharge Instructions      Your urinalysis does not show infection at this time , your urine will be sent to the lab to determine exactly which bacteria is present, if any changes need to be made to your medications you will be notified  Begin use of Macrobid  twice daily for 5 days  You may use over-the-counter Azo to help minimize your symptoms until antibiotic removes bacteria, this medication will turn your urine orange  Increase your fluid intake through use of water  As always  practice good hygiene, wiping front to back and avoidance of scented vaginal products to prevent further irritation  If symptoms continue to persist after use of medication or recur please follow-up with urgent care or your primary doctor as needed    ED Prescriptions     Medication Sig Dispense Auth. Provider   nitrofurantoin , macrocrystal-monohydrate, (MACROBID ) 100 MG capsule Take 1 capsule (100 mg total) by mouth 2 (two) times daily. 10 capsule Jobeth Pangilinan R, NP  PDMP not reviewed this encounter.   Teresa Shelba [place], [place] 06/18/24 [phone]

## 2024-06-18 NOTE — Discharge Instructions (Addendum)
 Your urinalysis does not show infection at this time, your urine will be sent to the lab to determine exactly which bacteria is present, if any changes need to be made to your medications you will be notified  Begin use of Macrobid  twice daily for 5 days  You may use over-the-counter Azo to help minimize your symptoms until antibiotic removes bacteria, this medication will turn your urine orange  Increase your fluid intake through use of water  As always practice good hygiene, wiping front to back and avoidance of scented vaginal products to prevent further irritation  If symptoms continue to persist after use of medication or recur please follow-up with urgent care or your primary doctor as needed

## 2024-06-18 NOTE — ED Triage Notes (Signed)
 Sx 3 days  Urinary frequency Blood in urine Dysuria

## 2024-06-19 ENCOUNTER — Ambulatory Visit: Payer: Self-pay

## 2024-06-19 LAB — URINE CULTURE: Culture: <10000 — AB

## 2024-06-20 ENCOUNTER — Ambulatory Visit (HOSPITAL_COMMUNITY): Payer: Self-pay

## 2024-06-29 ENCOUNTER — Ambulatory Visit
Admission: RE | Admit: 2024-06-29 | Discharge: 2024-06-29 | Disposition: A | Discharge status: Home / Self Care | Payer: Self-pay | Source: Ambulatory Visit | Attending: Internal Medicine | Admitting: Internal Medicine

## 2024-06-29 DIAGNOSIS — Z1231 Encounter for screening mammogram for malignant neoplasm of breast: Secondary | ICD-10-CM

## 2024-11-17 ENCOUNTER — Other Ambulatory Visit: Payer: Self-pay | Admitting: Internal Medicine

## 2024-11-17 DIAGNOSIS — E042 Nontoxic multinodular goiter: Secondary | ICD-10-CM

## 2024-12-06 ENCOUNTER — Other Ambulatory Visit (HOSPITAL_COMMUNITY)
Admission: RE | Admit: 2024-12-06 | Discharge: 2024-12-06 | Disposition: A | Discharge status: Home / Self Care | Source: Ambulatory Visit | Attending: Internal Medicine | Admitting: Internal Medicine

## 2024-12-06 ENCOUNTER — Inpatient Hospital Stay
Admission: RE | Admit: 2024-12-06 | Discharge: 2024-12-06 | Disposition: A | Source: Ambulatory Visit | Attending: Internal Medicine | Admitting: Internal Medicine

## 2024-12-06 DIAGNOSIS — E042 Nontoxic multinodular goiter: Secondary | ICD-10-CM | POA: Insufficient documentation

## 2024-12-09 LAB — CYTOLOGY - NON PAP

## 2024-12-21 ENCOUNTER — Other Ambulatory Visit: Payer: Self-pay | Admitting: Internal Medicine

## 2024-12-21 DIAGNOSIS — E042 Nontoxic multinodular goiter: Secondary | ICD-10-CM

## 2025-01-10 ENCOUNTER — Inpatient Hospital Stay: Admission: RE | Admit: 2025-01-10 | Source: Ambulatory Visit
